# Patient Record
Sex: Female | Born: 1990 | Race: White | Hispanic: No | Marital: Married | Smoking: Never smoker
Health system: Southern US, Community
[De-identification: ages and names within clinical notes are randomized; demographics above are authoritative.]

## PROBLEM LIST (undated history)

## (undated) DIAGNOSIS — F419 Anxiety disorder, unspecified: Secondary | ICD-10-CM

## (undated) DIAGNOSIS — G43909 Migraine, unspecified, not intractable, without status migrainosus: Secondary | ICD-10-CM

## (undated) DIAGNOSIS — R7989 Other specified abnormal findings of blood chemistry: Secondary | ICD-10-CM

## (undated) DIAGNOSIS — N946 Dysmenorrhea, unspecified: Secondary | ICD-10-CM

## (undated) HISTORY — DX: Other specified abnormal findings of blood chemistry: R79.89

## (undated) HISTORY — DX: Migraine, unspecified, not intractable, without status migrainosus: G43.909

## (undated) HISTORY — PX: ABDOMINOPLASTY: SUR9

## (undated) HISTORY — DX: Dysmenorrhea, unspecified: N94.6

## (undated) HISTORY — DX: Anxiety disorder, unspecified: F41.9

## (undated) HISTORY — PX: OTHER SURGICAL HISTORY: SHX169

## (undated) HISTORY — PX: AUGMENTATION MAMMAPLASTY: SUR837

---

## 2018-02-25 ENCOUNTER — Ambulatory Visit: Payer: BLUE CROSS/BLUE SHIELD | Admitting: Obstetrics and Gynecology

## 2018-02-25 ENCOUNTER — Encounter: Payer: Self-pay | Admitting: Obstetrics and Gynecology

## 2018-02-25 ENCOUNTER — Other Ambulatory Visit: Payer: Self-pay

## 2018-02-25 VITALS — BP 110/72 | HR 70 | Resp 16 | Ht 64.5 in | Wt 148.8 lb

## 2018-02-25 DIAGNOSIS — Z3009 Encounter for other general counseling and advice on contraception: Secondary | ICD-10-CM

## 2018-02-25 DIAGNOSIS — Z01419 Encounter for gynecological examination (general) (routine) without abnormal findings: Secondary | ICD-10-CM

## 2018-02-25 NOTE — Patient Instructions (Signed)

## 2018-02-25 NOTE — Progress Notes (Signed)
27 y.o. G6P1001 Married Caucasian/Brazilian female here for annual exam.    20 year old son with her today.  School not in session.  Taking Diflin ethinyl estradiol 35 mcg, acetato de ciproterona 2 mg. Has nausea.   Having some PMS symptoms.  Cramps, headaches, feeling irritated, anxious, sore breasts.   Weight gain since moving from [place], 6 kilos.  Difficult for her to explain.   Denies HTN, liver or breast cancer, DVT/PE Mother with cardiac disease.  Patient has migraines with aura.   Saw neurologist in [place].  Has son 17 yo.  From [place].  Husband works for [employer].   PCP:  None  Patient's last menstrual period was 02/23/2018 (exact date).     Period Cycle (Days): 30 Period Duration (Days): 6-7 days Period Pattern: Regular Menstrual Flow: Moderate Menstrual Control: Tampon, Thin pad Menstrual Control Change Freq (Hours): every 3 hours on heaviest day Dysmenorrhea: None     Sexually active: Yes.   female The current method of family planning is OCP (estrogen/progesterone).    Exercising: Yes.    works out at [employer] 3x/week. Smoker:  no  Health Maintenance: Pap: 04/2017 normal per patient History of abnormal Pap:  no MMG: 2018 normal in [place].    Colonoscopy:  n/a BMD:   n/a  Result  n/a TDaP:  02/2017 Gardasil:   She doesn't think so HIV: probably in pregnancy.    Screening Labs:    reports that she has never smoked. She has never used smokeless tobacco. She reports that she drinks about 1.0 standard drinks of alcohol per week. She reports that she does not use drugs.  Past Medical History:  Diagnosis Date  . Anxiety   . Dysmenorrhea   . Migraines    with aura    Past Surgical History:  Procedure Laterality Date  . CESAREAN SECTION  2014   in [place]    Current Outpatient Medications  Medication Sig Dispense Refill  . UNABLE TO FIND Med Name: Diclin --OCPs from [place]     No current facility-administered medications for this visit.     Family  History  Problem Relation Age of Onset  . Hypertension Mother   . Heart disease Mother     Review of Systems  Psychiatric/Behavioral: The patient is nervous/anxious.   All other systems reviewed and are negative.   Exam:   BP 110/72 (BP Location: Right Arm, Patient Position: Sitting, Cuff Size: Normal)   Pulse 70   Resp 16   Ht 5' 4.5" (1.638 m)   Wt 148 lb 12.8 oz (67.5 kg)   LMP 02/23/2018 (Exact Date)   BMI 25.15 kg/m     General appearance: alert, cooperative and appears stated age Head: Normocephalic, without obvious abnormality, atraumatic Neck: no adenopathy, supple, symmetrical, trachea midline and thyroid normal to inspection and palpation Lungs: clear to auscultation bilaterally Breasts: normal appearance, no masses or tenderness, No nipple retraction or dimpling, No nipple discharge or bleeding, No axillary or supraclavicular adenopathy Heart: regular rate and rhythm Abdomen: soft, non-tender; no masses, no organomegaly Extremities: extremities normal, atraumatic, no cyanosis or edema Skin: Skin color, texture, turgor normal. No rashes or lesions Lymph nodes: Cervical, supraclavicular, and axillary nodes normal. No abnormal inguinal nodes palpated Neurologic: Grossly normal  Pelvic: External genitalia:  no lesions              Urethra:  normal appearing urethra with no masses, tenderness or lesions  Bartholins and Skenes: normal                 Vagina: normal appearing vagina with normal color and discharge, no lesions              Cervix: no lesions.  Small amount of old blood.              Pap taken: No. Bimanual Exam:  Uterus:  normal size, contour, position, consistency, mobility, non-tender              Adnexa: no mass, fullness, tenderness     Chaperone was present for exam.  Assessment:   Well woman visit with normal exam. Migraines with aura.  Weight gain.   Plan: Mammogram screening. Recommended self breast awareness. Pap and HR HPV  as above. Guidelines for Calcium, Vitamin D, regular exercise program including cardiovascular and weight bearing exercise. Stop COCs.  Rationale explained.  Return for Mirena IUD.  Risks and benefits reviewed.  TSH, chol, CMP, CBC. Follow up annually and prn.    After visit summary provided.

## 2018-02-26 LAB — LIPID PANEL
Chol/HDL Ratio: 2.9 ratio (ref 0.0–4.4)
Cholesterol, Total: 247 mg/dL — ABNORMAL HIGH (ref 100–199)
HDL: 84 mg/dL (ref >39)
LDL Calculated: 146 mg/dL — ABNORMAL HIGH (ref 0–99)
TRIGLYCERIDES: 85 mg/dL (ref 0–149)
VLDL CHOLESTEROL CAL: 17 mg/dL (ref 5–40)

## 2018-02-26 LAB — COMPREHENSIVE METABOLIC PANEL
A/G RATIO: 1.7 (ref 1.2–2.2)
ALT: 7 IU/L (ref 0–32)
AST: 14 IU/L (ref 0–40)
Albumin: 4.4 g/dL (ref 3.5–5.5)
Alkaline Phosphatase: 89 IU/L (ref 39–117)
BILIRUBIN TOTAL: 0.3 mg/dL (ref 0.0–1.2)
BUN/Creatinine Ratio: 14 (ref 9–23)
BUN: 11 mg/dL (ref 6–20)
CHLORIDE: 99 mmol/L (ref 96–106)
CO2: 23 mmol/L (ref 20–29)
Calcium: 9.5 mg/dL (ref 8.7–10.2)
Creatinine, Ser: 0.77 mg/dL (ref 0.57–1.00)
GFR calc Af Amer: 122 mL/min/{1.73_m2} (ref >59)
GFR calc non Af Amer: 105 mL/min/{1.73_m2} (ref >59)
GLOBULIN, TOTAL: 2.6 g/dL (ref 1.5–4.5)
Glucose: 83 mg/dL (ref 65–99)
POTASSIUM: 3.8 mmol/L (ref 3.5–5.2)
SODIUM: 139 mmol/L (ref 134–144)
TOTAL PROTEIN: 7 g/dL (ref 6.0–8.5)

## 2018-02-26 LAB — CBC
HEMATOCRIT: 36.4 % (ref 34.0–46.6)
Hemoglobin: 12.3 g/dL (ref 11.1–15.9)
MCH: 28.6 pg (ref 26.6–33.0)
MCHC: 33.8 g/dL (ref 31.5–35.7)
MCV: 85 fL (ref 79–97)
PLATELETS: 292 10*3/uL (ref 150–450)
RBC: 4.3 x10E6/uL (ref 3.77–5.28)
RDW: 12.4 % (ref 12.3–15.4)
WBC: 6.2 10*3/uL (ref 3.4–10.8)

## 2018-02-26 LAB — TSH: TSH: 2.43 u[IU]/mL (ref 0.450–4.500)

## 2018-03-01 ENCOUNTER — Telehealth: Payer: Self-pay | Admitting: *Deleted

## 2018-03-01 NOTE — Telephone Encounter (Signed)
Notes recorded by Leda MinHamm, Peter Keyworth N, RN on 03/01/2018 at 1:51 PM EST Call placed to patient using [facility] [place], [place] #[id number].  No answer on patients mobile number, voicemail not set up. Call placed to spouse, Meta HatchetGustavo, ok per dpr. No answer, voicemail not set up.

## 2018-03-01 NOTE — Telephone Encounter (Signed)
-----   Message from Brandy SallesBrook E Amundson C Rhodus, MD sent at 03/01/2018  6:56 AM EST ----- Please report results to patient.  You may need a [place] interpretor.  Her total cholesterol and LDL are elevated, but her cholesterol ratios are normal. A diet low in cholesterol and exercise can help to lower her LDL.  Her blood chemistries, blood counts, and thyroid are all normal.

## 2018-03-05 NOTE — Telephone Encounter (Signed)
Notes recorded by Leda MinHamm, Kiah Vanalstine N, RN on 03/05/2018 at 12:30 PM EST Call placed to patient using [facility] [place], [place] #[id number].  No answer on patients mobile number, voicemail not set up. Call placed to spouse, Meta HatchetGustavo, ok per dpr. No answer, voicemail not set up.

## 2018-03-08 ENCOUNTER — Telehealth: Payer: Self-pay | Admitting: Obstetrics and Gynecology

## 2018-03-08 NOTE — Telephone Encounter (Signed)
Patient cancelled her mirena insertion because of a conference at [employer]. Would like a call to reschedule.

## 2018-03-08 NOTE — Telephone Encounter (Signed)
Call returned using [facility] [place], [place]# [id number]. No answer, voicemail not set up.  1. Reschedule Mirena IUD insertion.   2. See lab results dated 02/25/18

## 2018-03-12 ENCOUNTER — Ambulatory Visit: Payer: BLUE CROSS/BLUE SHIELD | Admitting: Obstetrics and Gynecology

## 2018-03-12 NOTE — Telephone Encounter (Signed)
Spoke with patient using [address] [place], [place] #[id number].   1. Advised of lab results dated 02/25/18 per Dr. Edward JollySilva.   2. LMP 02/23/18. OCP for contraceptive. Mirena IUD insertion scheduled for 03/21/18 at 3pm with Dr. Edward JollySilva. Advised to take Motrin 800 mg with food and water one hour before procedure.  Patient verbalizes understanding and is agreeable.   Routing to provider for final review. Patient is agreeable to disposition. Will close encounter.

## 2018-03-12 NOTE — Telephone Encounter (Signed)
Results reviewed with patient, see telephone encounter dated 03/08/18.   Encounter closed.

## 2018-03-21 ENCOUNTER — Other Ambulatory Visit: Payer: Self-pay

## 2018-03-21 ENCOUNTER — Encounter: Payer: Self-pay | Admitting: Obstetrics and Gynecology

## 2018-03-21 ENCOUNTER — Ambulatory Visit (INDEPENDENT_AMBULATORY_CARE_PROVIDER_SITE_OTHER): Payer: BLUE CROSS/BLUE SHIELD | Admitting: Obstetrics and Gynecology

## 2018-03-21 VITALS — BP 108/70 | HR 80 | Resp 16 | Wt 154.0 lb

## 2018-03-21 DIAGNOSIS — Z3009 Encounter for other general counseling and advice on contraception: Secondary | ICD-10-CM

## 2018-03-21 DIAGNOSIS — Z30430 Encounter for insertion of intrauterine contraceptive device: Secondary | ICD-10-CM

## 2018-03-21 DIAGNOSIS — Z01812 Encounter for preprocedural laboratory examination: Secondary | ICD-10-CM | POA: Diagnosis not present

## 2018-03-21 LAB — POCT URINE PREGNANCY: Preg Test, Ur: NEGATIVE

## 2018-03-21 NOTE — Progress Notes (Signed)
GYNECOLOGY  VISIT   HPI: 27 y.o.   Married  Caucasian/Brazilian  female   G1P1001 with Patient's last menstrual period was 02/23/2018 (exact date).   here for Mirena IUD insertion.    Took Tylenol prior to visit.   Did not use Cytotec.  UPT - negative.   GYNECOLOGIC HISTORY: Patient's last menstrual period was 02/23/2018 (exact date). Contraception: OCP Menopausal hormone therapy:  n/a Last mammogram: 2018 normal in [place] per patient Last pap smear: 04/2017 normal per patient        OB History    Gravida  1   Para  1   Term  1   Preterm      AB      Living  1     SAB      TAB      Ectopic      Multiple      Live Births                 There are no active problems to display for this patient.   Past Medical History:  Diagnosis Date  . Anxiety   . Dysmenorrhea   . Migraines    with aura    Past Surgical History:  Procedure Laterality Date  . CESAREAN SECTION  2014   in [place]    Current Outpatient Medications  Medication Sig Dispense Refill  . UNABLE TO FIND Med Name: Diclin --OCPs from [place]     No current facility-administered medications for this visit.      ALLERGIES: Patient has no known allergies.  Family History  Problem Relation Age of Onset  . Hypertension Mother   . Heart disease Mother     Social History   Socioeconomic History  . Marital status: Married    Spouse name: Not on file  . Number of children: Not on file  . Years of education: Not on file  . Highest education level: Not on file  Occupational History  . Not on file  Social Needs  . Financial resource strain: Not on file  . Food insecurity:    Worry: Not on file    Inability: Not on file  . Transportation needs:    Medical: Not on file    Non-medical: Not on file  Tobacco Use  . Smoking status: Never Smoker  . Smokeless tobacco: Never Used  Substance and Sexual Activity  . Alcohol use: Yes    Alcohol/week: 1.0 standard drinks    Types: 1  Glasses of wine per week  . Drug use: Never  . Sexual activity: Yes    Birth control/protection: Pill  Lifestyle  . Physical activity:    Days per week: Not on file    Minutes per session: Not on file  . Stress: Not on file  Relationships  . Social connections:    Talks on phone: Not on file    Gets together: Not on file    Attends religious service: Not on file    Active member of club or organization: Not on file    Attends meetings of clubs or organizations: Not on file    Relationship status: Not on file  . Intimate partner violence:    Fear of current or ex partner: Not on file    Emotionally abused: Not on file    Physically abused: Not on file    Forced sexual activity: Not on file  Other Topics Concern  . Not on file  Social History  Narrative  . Not on file    Review of Systems  Constitutional: Negative.   HENT: Negative.   Eyes: Negative.   Respiratory: Negative.   Cardiovascular: Negative.   Gastrointestinal: Negative.   Endocrine: Negative.   Genitourinary: Negative.   Musculoskeletal: Negative.   Skin: Negative.   Allergic/Immunologic: Negative.   Neurological: Negative.   Hematological: Negative.   Psychiatric/Behavioral: Negative.     PHYSICAL EXAMINATION:    BP 108/70 (BP Location: Right Arm, Patient Position: Sitting, Cuff Size: Normal)   Pulse 80   Resp 16   Wt 154 lb (69.9 kg)   LMP 02/23/2018 (Exact Date)   BMI 26.03 kg/m     General appearance: alert, cooperative and appears stated age   Pelvic: External genitalia:  no lesions              Urethra:  normal appearing urethra with no masses, tenderness or lesions              Bartholins and Skenes: normal                 Vagina: normal appearing vagina with normal color and discharge, no lesions              Cervix: no lesions                Bimanual Exam:  Uterus:  normal size, contour, position, consistency, mobility, non-tender              Adnexa: no mass, fullness, tenderness           Consent for Mirena IUD insertion.  Lot  [id number]     , expiration  [id number]. Speculum placed in vagina.  Sterile prep of cervix with Hibiclens Paracervical block with 10 cc 1% lidocaine - lot [id number]     , expiration  1/23. Tenaculum to anterior cervical lip.  Os finder used.  Uterus sounded to  7.5  cm.  Mirena IUD placed without difficulty.  Strings trimmed and shown to patient.  Repeat bimanual exam, no change. No complications.  Minimal EBL.  ASSESSMENT  IUD insertion.   PLAN  Instructions and precautions given.  Back up contraception discussed. Card given to patient with insertion date, recommended removal date, and lot number.  Follow up for a recheck in 4 weeks, sooner as needed.  After visit summary to patient.    An After Visit Summary was printed and given to the patient.

## 2018-03-21 NOTE — Patient Instructions (Signed)

## 2018-04-22 ENCOUNTER — Other Ambulatory Visit: Payer: Self-pay

## 2018-04-22 ENCOUNTER — Ambulatory Visit: Payer: BLUE CROSS/BLUE SHIELD | Admitting: Obstetrics and Gynecology

## 2018-04-22 ENCOUNTER — Encounter: Payer: Self-pay | Admitting: Obstetrics and Gynecology

## 2018-04-22 VITALS — BP 100/62 | HR 84 | Ht 64.5 in | Wt 154.0 lb

## 2018-04-22 DIAGNOSIS — N9411 Superficial (introital) dyspareunia: Secondary | ICD-10-CM

## 2018-04-22 DIAGNOSIS — Z30431 Encounter for routine checking of intrauterine contraceptive device: Secondary | ICD-10-CM | POA: Diagnosis not present

## 2018-04-22 DIAGNOSIS — R635 Abnormal weight gain: Secondary | ICD-10-CM | POA: Diagnosis not present

## 2018-04-22 NOTE — Progress Notes (Signed)
GYNECOLOGY  VISIT   HPI: 28 y.o.   Married  Caucasian/brazilian  female   G1P1001 with Patient's last menstrual period was 02/23/2018 (exact date).   here for 4 week follow up after Mirena IUD insertion.    Some cramping.  Having spotting.   Husband not reporting pain with intercourse.  Patient states she has insertional dyspareunia.  No pain with the rest of sexual activity.   Using neutral soap.  Worried about weight gain.  Exercising and eating right and still gaining.  Normal TSH 02/25/18.  GYNECOLOGIC HISTORY: Patient's last menstrual period was 02/23/2018 (exact date). Contraception:  Mirena IUD--03-21-18 Menopausal hormone therapy:  n/a Last mammogram:  n/a Last pap smear:   04/2017 normal per patient        OB History    Gravida  1   Para  1   Term  1   Preterm      AB      Living  1     SAB      TAB      Ectopic      Multiple      Live Births                 There are no active problems to display for this patient.   Past Medical History:  Diagnosis Date  . Anxiety   . Dysmenorrhea   . Migraines    with aura    Past Surgical History:  Procedure Laterality Date  . CESAREAN SECTION  2014   in [place]    Current Outpatient Medications  Medication Sig Dispense Refill  . levonorgestrel (MIRENA) 20 MCG/24HR IUD 1 each by Intrauterine route once.     No current facility-administered medications for this visit.      ALLERGIES: Patient has no known allergies.  Family History  Problem Relation Age of Onset  . Hypertension Mother   . Heart disease Mother     Social History   Socioeconomic History  . Marital status: Married    Spouse name: Not on file  . Number of children: Not on file  . Years of education: Not on file  . Highest education level: Not on file  Occupational History  . Not on file  Social Needs  . Financial resource strain: Not on file  . Food insecurity:    Worry: Not on file    Inability: Not on file  .  Transportation needs:    Medical: Not on file    Non-medical: Not on file  Tobacco Use  . Smoking status: Never Smoker  . Smokeless tobacco: Never Used  Substance and Sexual Activity  . Alcohol use: Yes    Alcohol/week: 1.0 standard drinks    Types: 1 Glasses of wine per week  . Drug use: Never  . Sexual activity: Yes    Birth control/protection: Pill  Lifestyle  . Physical activity:    Days per week: Not on file    Minutes per session: Not on file  . Stress: Not on file  Relationships  . Social connections:    Talks on phone: Not on file    Gets together: Not on file    Attends religious service: Not on file    Active member of club or organization: Not on file    Attends meetings of clubs or organizations: Not on file    Relationship status: Not on file  . Intimate partner violence:    Fear of current or  ex partner: Not on file    Emotionally abused: Not on file    Physically abused: Not on file    Forced sexual activity: Not on file  Other Topics Concern  . Not on file  Social History Narrative  . Not on file    Review of Systems  All other systems reviewed and are negative.   PHYSICAL EXAMINATION:    BP 100/62 (BP Location: Right Arm, Patient Position: Sitting, Cuff Size: Normal)   Pulse 84   Ht 5' 4.5" (1.638 m)   Wt 154 lb (69.9 kg)   LMP 02/23/2018 (Exact Date)   BMI 26.03 kg/m     General appearance: alert, cooperative and appears stated age     Pelvic: External genitalia:  no lesions              Urethra:  normal appearing urethra with no masses, tenderness or lesions              Bartholins and Skenes: normal                 Vagina: normal appearing vagina with normal color and discharge, no lesions              Cervix: no lesions.  IUD strings noted.  Small amount of dark blood noted.                 Bimanual Exam:  Uterus:  normal size, contour, position, consistency, mobility, non-tender              Adnexa: no mass, fullness, tenderness             Chaperone was present for exam.  ASSESSMENT  IUD check up.  Introital dyspareunia.  Weight gain.   PLAN  I discussed bleeding profiles with Mirena and gave reassurance regarding pregnancy prevention.  She will try water based lubricant for intercourse.  She will avoid local irritants.  Information given about Dr. Quillian Quince.  I do not recommend any additional lab work today.  Fu for annual exam and prn.    An After Visit Summary was printed and given to the patient.  ___25___ minutes face to face time of which over 50% was spent in counseling.

## 2018-08-29 ENCOUNTER — Telehealth: Payer: Self-pay | Admitting: Obstetrics and Gynecology

## 2018-08-29 ENCOUNTER — Encounter: Payer: Self-pay | Admitting: Obstetrics and Gynecology

## 2018-08-29 ENCOUNTER — Other Ambulatory Visit: Payer: Self-pay

## 2018-08-29 ENCOUNTER — Other Ambulatory Visit: Payer: BLUE CROSS/BLUE SHIELD

## 2018-08-29 ENCOUNTER — Ambulatory Visit (INDEPENDENT_AMBULATORY_CARE_PROVIDER_SITE_OTHER): Payer: BLUE CROSS/BLUE SHIELD

## 2018-08-29 ENCOUNTER — Other Ambulatory Visit: Payer: BLUE CROSS/BLUE SHIELD | Admitting: Obstetrics and Gynecology

## 2018-08-29 ENCOUNTER — Ambulatory Visit (INDEPENDENT_AMBULATORY_CARE_PROVIDER_SITE_OTHER): Payer: BLUE CROSS/BLUE SHIELD | Admitting: Obstetrics and Gynecology

## 2018-08-29 VITALS — BP 102/64 | HR 64 | Temp 97.9°F | Resp 16 | Wt 147.0 lb

## 2018-08-29 DIAGNOSIS — Z30431 Encounter for routine checking of intrauterine contraceptive device: Secondary | ICD-10-CM

## 2018-08-29 DIAGNOSIS — R102 Pelvic and perineal pain unspecified side: Secondary | ICD-10-CM

## 2018-08-29 DIAGNOSIS — N83201 Unspecified ovarian cyst, right side: Secondary | ICD-10-CM

## 2018-08-29 LAB — POCT URINE PREGNANCY: Preg Test, Ur: NEGATIVE

## 2018-08-29 NOTE — Telephone Encounter (Signed)
Patient is having pain, she thinks may be related to her IUD.

## 2018-08-29 NOTE — Telephone Encounter (Signed)
Patient has a Mirena IUD that was placed 03/21/2018. Since Monday 08/25/2017 patient has been having ongoing pelvic pain. Reports is is painful when she sits, walking, and uses the restroom. "It feels like someone is sticking a needle in me." Denies any vaginal bleeding, fever, chills, or urinary symptoms. Reports nausea without vomiting or bowel changes. Reports pain is a 5/10 currently after taking Pamprin. Pain can be a 8/10 at its peak. Advised will review with Dr.Ricci and return call. Patient is agreeable.

## 2018-08-29 NOTE — Telephone Encounter (Signed)
Spoke with patient. Appointment for PUS scheduled for today at 2:30 pm with 3 pm consult with Dr.Haran. Patient is agreeable to date and time. Order placed.   Routing to provider and will close encounter.

## 2018-08-29 NOTE — Progress Notes (Signed)
GYNECOLOGY  VISIT   HPI: 28 y.o.   Married  Caucasian/Brazilian  female   G1P1001 with No LMP recorded.   here for ultrasound, pelvic pain with IUD    3 days ago developed a cramping pain and poking pain in her back and vaginal/pelvic discomfort.  She feels a colic like pain that comes and goes and is sharp in nature.  She feels vaginal pain and rectal pain also that feels like a pressure.  This is constant.  No dysuria.   She had a similar pain to this during the last 2 years.  The pain this week is more strong and is similar to like when she had her IUD placed.   No vaginal bleeding.   She felt a little bit nausea.  No fever.   No vomiting or diarrhea.   GYNECOLOGIC HISTORY: No LMP recorded. Contraception:  IUD - Mirena Menopausal hormone therapy:  none Last mammogram:  none Last pap smear:   04/2017 neg per patient upt-neg         OB History    Gravida  1   Para  1   Term  1   Preterm      AB      Living  1     SAB      TAB      Ectopic      Multiple      Live Births                 There are no active problems to display for this patient.   Past Medical History:  Diagnosis Date  . Anxiety   . Dysmenorrhea   . Migraines    with aura    Past Surgical History:  Procedure Laterality Date  . CESAREAN SECTION  2014   in [place]    Current Outpatient Medications  Medication Sig Dispense Refill  . levonorgestrel (MIRENA) 20 MCG/24HR IUD 1 each by Intrauterine route once.    . Semaglutide (OZEMPIC, 0.25 OR 0.5 MG/DOSE, [place]) Inject into the skin.     No current facility-administered medications for this visit.      ALLERGIES: Patient has no known allergies.  Family History  Problem Relation Age of Onset  . Hypertension Mother   . Heart disease Mother     Social History   Socioeconomic History  . Marital status: Married    Spouse name: Not on file  . Number of children: Not on file  . Years of education: Not on file  . Highest  education level: Not on file  Occupational History  . Not on file  Social Needs  . Financial resource strain: Not on file  . Food insecurity:    Worry: Not on file    Inability: Not on file  . Transportation needs:    Medical: Not on file    Non-medical: Not on file  Tobacco Use  . Smoking status: Never Smoker  . Smokeless tobacco: Never Used  Substance and Sexual Activity  . Alcohol use: Yes    Alcohol/week: 0.0 - 1.0 standard drinks  . Drug use: Never  . Sexual activity: Yes    Partners: Male    Birth control/protection: I.U.D.  Lifestyle  . Physical activity:    Days per week: Not on file    Minutes per session: Not on file  . Stress: Not on file  Relationships  . Social connections:    Talks on phone: Not on file  Gets together: Not on file    Attends religious service: Not on file    Active member of club or organization: Not on file    Attends meetings of clubs or organizations: Not on file    Relationship status: Not on file  . Intimate partner violence:    Fear of current or ex partner: Not on file    Emotionally abused: Not on file    Physically abused: Not on file    Forced sexual activity: Not on file  Other Topics Concern  . Not on file  Social History Narrative  . Not on file    Review of Systems  Constitutional: Negative.        Nausea  HENT: Negative.   Eyes: Negative.   Respiratory: Negative.   Cardiovascular: Negative.   Gastrointestinal: Negative.   Endocrine: Negative.   Genitourinary: Positive for pelvic pain.       Back pain  Musculoskeletal: Negative.   Skin: Negative.   Allergic/Immunologic: Negative.   Neurological: Negative.   Psychiatric/Behavioral: Negative.     PHYSICAL EXAMINATION:    BP 102/64   Pulse 64   Temp 97.9 F (36.6 C) (Skin)   Resp 16   Wt 147 lb (66.7 kg)   BMI 24.84 kg/m     General appearance: alert, cooperative and appears stated age.  Smiling.  Appears comfortable.  Pelvic: External genitalia:   no lesions              Urethra:  normal appearing urethra with no masses, tenderness or lesions              Bartholins and Skenes: normal                 Vagina: normal appearing vagina with normal color and discharge, no lesions              Cervix: no lesions.  IUD strings noted.                 Bimanual Exam:  Uterus:  normal size, contour, position, consistency, mobility, non-tender              Adnexa: tenderness and fullness of right adnexa.  Tender to palpation behind the cervix in the cul de sac.           Chaperone was present for exam.  Pelvic US Uterus not masses.  IUD in correct position in endometrial canal.  Right ovary with hemorrhagic ovarian cyst - 41 x 36 x 33 mm.  Left ovary normal.  Mild to mod fluid in cul de sac and right adnexa.  ASSESSMENT  Mirena IUD.  Hemorrhagic right ovarian cyst, ruptured.  No acute abdomen.   PLAN  UPT now.  Aleve 2 po bid prn. Pelvic rest until pain resolves.  I discussed risk of torsion.  Return for pelvic US in 6 weeks.   An After Visit Summary was printed and given to the patient.  __25____ minutes face to face time of which over 50% was spent in counseling.

## 2018-08-29 NOTE — Telephone Encounter (Signed)
Please schedule for office visit today and pelvic ultrasound.   Cc- Billie Ruddy, Harland Dingwall

## 2018-10-08 ENCOUNTER — Telehealth: Payer: Self-pay | Admitting: Obstetrics and Gynecology

## 2018-10-08 NOTE — Telephone Encounter (Signed)
Call placed to patient to schedule follow up ultrasound and to obtain new insurance information for pre-certification process. Unable to leave a voicemail message, as voicemail box is not set up.

## 2018-10-14 NOTE — Telephone Encounter (Signed)
Second cal placed to patient to scheduled a follow up ultrasound and to obtain new insurance information for prior approval. Again unable to leave a voicemail message, as the patients voice mailbox is not set up.    cc: Lamont Snowball, RN

## 2018-11-14 ENCOUNTER — Ambulatory Visit (INDEPENDENT_AMBULATORY_CARE_PROVIDER_SITE_OTHER): Payer: BC Managed Care – PPO

## 2018-11-14 ENCOUNTER — Encounter: Payer: Self-pay | Admitting: Obstetrics and Gynecology

## 2018-11-14 ENCOUNTER — Ambulatory Visit: Payer: BC Managed Care – PPO | Admitting: Obstetrics and Gynecology

## 2018-11-14 ENCOUNTER — Other Ambulatory Visit: Payer: Self-pay

## 2018-11-14 VITALS — BP 100/72 | HR 76 | Temp 97.8°F | Resp 12 | Ht 64.5 in | Wt 150.6 lb

## 2018-11-14 DIAGNOSIS — Z8742 Personal history of other diseases of the female genital tract: Secondary | ICD-10-CM

## 2018-11-14 DIAGNOSIS — G4700 Insomnia, unspecified: Secondary | ICD-10-CM

## 2018-11-14 DIAGNOSIS — N83201 Unspecified ovarian cyst, right side: Secondary | ICD-10-CM

## 2018-11-14 DIAGNOSIS — R4586 Emotional lability: Secondary | ICD-10-CM | POA: Diagnosis not present

## 2018-11-14 NOTE — Progress Notes (Signed)
GYNECOLOGY  VISIT   HPI: 28 y.o.   Married  [place]  female   [phone] with Patient's last menstrual period was 09/04/2018.   here for ultrasound for right ovarian cyst  No more pain like she had when she was diagnosed with the cyst.   Prior US showing a hemorrhagic right ovarian cyst 41 x 36 x 33 mm on 08/29/18.   Likes her Mirena overall.  No regular menses.  Has some mood swings during the pandemic. She wonders if it cycle related.  Also not sleeping well. Has insomnia. Tried melatonin.  She had mood swings prior to the Mirena IUD also.  GYNECOLOGIC HISTORY: Patient's last menstrual period was 09/04/2018. Contraception:  Mirena IUD  Menopausal hormone therapy:  n/a Last mammogram:  n/a Last pap smear:   04/2017 neg per patient        OB History    Gravida  1   Para  1   Term  1   Preterm      AB      Living  1     SAB      TAB      Ectopic      Multiple      Live Births                 There are no active problems to display for this patient.   Past Medical History:  Diagnosis Date  . Anxiety   . Dysmenorrhea   . Migraines    with aura    Past Surgical History:  Procedure Laterality Date  . CESAREAN SECTION  2014   in [place]    Current Outpatient Medications  Medication Sig Dispense Refill  . levonorgestrel (MIRENA) 20 MCG/24HR IUD 1 each by Intrauterine route once.     No current facility-administered medications for this visit.      ALLERGIES: Patient has no known allergies.  Family History  Problem Relation Age of Onset  . Hypertension Mother   . Heart disease Mother     Social History   Socioeconomic History  . Marital status: Married    Spouse name: Not on file  . Number of children: Not on file  . Years of education: Not on file  . Highest education level: Not on file  Occupational History  . Not on file  Social Needs  . Financial resource strain: Not on file  . Food insecurity    Worry: Not on file   Inability: Not on file  . Transportation needs    Medical: Not on file    Non-medical: Not on file  Tobacco Use  . Smoking status: Never Smoker  . Smokeless tobacco: Never Used  Substance and Sexual Activity  . Alcohol use: Yes    Alcohol/week: 0.0 - 1.0 standard drinks  . Drug use: Never  . Sexual activity: Yes    Partners: Male    Birth control/protection: I.U.D.  Lifestyle  . Physical activity    Days per week: Not on file    Minutes per session: Not on file  . Stress: Not on file  Relationships  . Social Musicianconnections    Talks on phone: Not on file    Gets together: Not on file    Attends religious service: Not on file    Active member of club or organization: Not on file    Attends meetings of clubs or organizations: Not on file    Relationship status: Not on file  .  Intimate partner violence    Fear of current or ex partner: Not on file    Emotionally abused: Not on file    Physically abused: Not on file    Forced sexual activity: Not on file  Other Topics Concern  . Not on file  Social History Narrative  . Not on file    Review of Systems  Constitutional: Negative.   HENT: Negative.   Eyes: Negative.   Respiratory: Negative.   Cardiovascular: Negative.   Gastrointestinal: Negative.   Endocrine: Negative.   Genitourinary: Negative.   Musculoskeletal: Negative.   Skin: Negative.   Allergic/Immunologic: Negative.   Neurological: Negative.   Hematological: Negative.   Psychiatric/Behavioral: Negative.     PHYSICAL EXAMINATION:    BP 100/72 (BP Location: Left Arm, Patient Position: Sitting, Cuff Size: Normal)   Pulse 76   Temp 97.8 F (36.6 C) (Temporal)   Resp 12   Ht 5' 4.5" (1.638 m)   Wt 150 lb 9.6 oz (68.3 kg)   LMP 09/04/2018   BMI 25.45 kg/m     General appearance: alert, cooperative and appears stated age   Pelvic US Uterus no masses.  EMS 2.37 mm. IUD in endometrial canal.  Ovaries normal.  Cyst resolved.  No free fluid.    ASSESSMENT  Ovarian cyst resolved.  Mirena IUD.  Migraine with aura.  Mood swings.    PLAN  Reassurance regarding resolution of cyst.  We discussed that Mirena does not block ovulation and cyst formation of the ovaries.  She is not a candidate for combined oral contraception due to her migraine with aura.  I gave her the name of Dr. Ruben Gottron for general medical care.  I encouraged her to do web based consultation with a psychologist in [place] as she wants to consult with someone who speaks [place].  Fu in Nov for annual exam.   An After Visit Summary was printed and given to the patient.  _15_____ minutes face to face time of which over 50% was spent in counseling.

## 2018-11-14 NOTE — Patient Instructions (Signed)
Please consider Dr. Rolan Lipa in [facility] for a general medicine doctor.

## 2019-01-21 DIAGNOSIS — R635 Abnormal weight gain: Secondary | ICD-10-CM | POA: Diagnosis not present

## 2019-01-21 DIAGNOSIS — E559 Vitamin D deficiency, unspecified: Secondary | ICD-10-CM | POA: Diagnosis not present

## 2019-01-21 DIAGNOSIS — E538 Deficiency of other specified B group vitamins: Secondary | ICD-10-CM | POA: Diagnosis not present

## 2019-01-21 DIAGNOSIS — Z13220 Encounter for screening for lipoid disorders: Secondary | ICD-10-CM | POA: Diagnosis not present

## 2019-01-21 DIAGNOSIS — Z1321 Encounter for screening for nutritional disorder: Secondary | ICD-10-CM | POA: Diagnosis not present

## 2019-01-21 DIAGNOSIS — R5383 Other fatigue: Secondary | ICD-10-CM | POA: Diagnosis not present

## 2019-01-21 DIAGNOSIS — Z1329 Encounter for screening for other suspected endocrine disorder: Secondary | ICD-10-CM | POA: Diagnosis not present

## 2019-01-21 DIAGNOSIS — G43909 Migraine, unspecified, not intractable, without status migrainosus: Secondary | ICD-10-CM | POA: Diagnosis not present

## 2019-02-12 DIAGNOSIS — M7989 Other specified soft tissue disorders: Secondary | ICD-10-CM | POA: Diagnosis not present

## 2019-02-12 DIAGNOSIS — G43909 Migraine, unspecified, not intractable, without status migrainosus: Secondary | ICD-10-CM | POA: Diagnosis not present

## 2019-02-12 DIAGNOSIS — R5383 Other fatigue: Secondary | ICD-10-CM | POA: Diagnosis not present

## 2019-02-12 DIAGNOSIS — L709 Acne, unspecified: Secondary | ICD-10-CM | POA: Diagnosis not present

## 2019-03-07 ENCOUNTER — Telehealth: Payer: Self-pay | Admitting: Obstetrics and Gynecology

## 2019-03-07 DIAGNOSIS — Z30432 Encounter for removal of intrauterine contraceptive device: Secondary | ICD-10-CM

## 2019-03-07 NOTE — Telephone Encounter (Signed)
Call to patient to convey benefits for iud removal. Spoke with the patient she understands/agreeable with the benefits. Patient is aware of the cancellation policy. Appointment scheduled 03/11/19.

## 2019-03-07 NOTE — Telephone Encounter (Signed)
Patient is calling to schedule IUD removal. °

## 2019-03-07 NOTE — Telephone Encounter (Signed)
Spoke with pt. Pt wanting IUD removed d/t migraines getting worse, cramping and weight gain. Pt wanting to go back to [facility]. Pt states went to family doctor in [facility] Dr Benjaman Kindler and they suggested for IUD to be removed. Had IUD in for 1 year Dec 2020. Pt scheduled 03/11/19 at 3:30pm with Dr Quincy Simmonds. Pt aware of call for benefits.   Will route to Dr Quincy Simmonds for review. Will close encounter   Orders placed for IUD removal.  Cc: Weston Brass

## 2019-03-11 ENCOUNTER — Ambulatory Visit (INDEPENDENT_AMBULATORY_CARE_PROVIDER_SITE_OTHER): Payer: BC Managed Care – PPO | Admitting: Obstetrics and Gynecology

## 2019-03-11 ENCOUNTER — Encounter: Payer: Self-pay | Admitting: Obstetrics and Gynecology

## 2019-03-11 ENCOUNTER — Other Ambulatory Visit: Payer: Self-pay

## 2019-03-11 VITALS — BP 120/62 | HR 70 | Temp 97.4°F | Ht 64.5 in | Wt 158.4 lb

## 2019-03-11 DIAGNOSIS — Z3009 Encounter for other general counseling and advice on contraception: Secondary | ICD-10-CM

## 2019-03-11 DIAGNOSIS — Z30432 Encounter for removal of intrauterine contraceptive device: Secondary | ICD-10-CM | POA: Diagnosis not present

## 2019-03-11 MED ORDER — NORETHINDRONE 0.35 MG PO TABS: 1.0000 | ORAL_TABLET | Freq: Every day | ORAL | 3 refills | Status: DC

## 2019-03-11 NOTE — Progress Notes (Signed)
GYNECOLOGY  VISIT   HPI: 28 y.o.   Married  [place]  female   G1P1001 with No LMP recorded.   here for Mirena IUD removal.   She had the IUD placed to control her migraine headaches.  She headaches are worse now.  Also having cramping in her lower abdomen.   She is having irregular bleeding.   She has seen a PCP for HA and weight loss.  She was told her progesterone is low and her estrogen is high.  She has a HA almost every day.  She had migraine 2 days per week and cannot get out of the bed.   She is having issues with acne and losing her hair.  GYNECOLOGIC HISTORY: No LMP recorded. Contraception: Mirena IUD - December 03/21/18.  Menopausal hormone therapy:  n/a Last mammogram:  n/a Last pap smear: 04/2017 Neg per patient        OB History    Gravida  1   Para  1   Term  1   Preterm      AB      Living  1     SAB      TAB      Ectopic      Multiple      Live Births                 There are no active problems to display for this patient.   Past Medical History:  Diagnosis Date  . Anxiety   . Dysmenorrhea   . Migraines    with aura    Past Surgical History:  Procedure Laterality Date  . CESAREAN SECTION  2014   in [place]    Current Outpatient Medications  Medication Sig Dispense Refill  . levonorgestrel (MIRENA) 20 MCG/24HR IUD 1 each by Intrauterine route once.     No current facility-administered medications for this visit.      ALLERGIES: Patient has no known allergies.  Family History  Problem Relation Age of Onset  . Hypertension Mother   . Heart disease Mother     Social History   Socioeconomic History  . Marital status: Married    Spouse name: Not on file  . Number of children: Not on file  . Years of education: Not on file  . Highest education level: Not on file  Occupational History  . Not on file  Social Needs  . Financial resource strain: Not on file  . Food insecurity    Worry: Not on file    Inability:  Not on file  . Transportation needs    Medical: Not on file    Non-medical: Not on file  Tobacco Use  . Smoking status: Never Smoker  . Smokeless tobacco: Never Used  Substance and Sexual Activity  . Alcohol use: Yes    Alcohol/week: 0.0 - 1.0 standard drinks  . Drug use: Never  . Sexual activity: Yes    Partners: Male    Birth control/protection: I.U.D.  Lifestyle  . Physical activity    Days per week: Not on file    Minutes per session: Not on file  . Stress: Not on file  Relationships  . Social Musician on phone: Not on file    Gets together: Not on file    Attends religious service: Not on file    Active member of club or organization: Not on file    Attends meetings of clubs or organizations: Not  on file    Relationship status: Not on file  . Intimate partner violence    Fear of current or ex partner: Not on file    Emotionally abused: Not on file    Physically abused: Not on file    Forced sexual activity: Not on file  Other Topics Concern  . Not on file  Social History Narrative  . Not on file    Review of Systems  All other systems reviewed and are negative.   PHYSICAL EXAMINATION:    BP 120/62   Pulse 70   Temp (!) 97.4 F (36.3 C) (Temporal)   Ht 5' 4.5" (1.638 m)   Wt 158 lb 6.4 oz (71.8 kg)   BMI 26.77 kg/m     General appearance: alert, cooperative and appears stated age    Pelvic: External genitalia:  no lesions              Urethra:  normal appearing urethra with no masses, tenderness or lesions              Bartholins and Skenes: normal                 Vagina: normal appearing vagina with normal color and discharge, no lesions              Cervix: no lesions.  IUD strings noted.                Bimanual Exam:  Uterus:  normal size, contour, position, consistency, mobility, non-tender              Adnexa: no mass, fullness, tenderness           IUD removal.  Verbal consent for procedure.  IUD removed with ring forceps,  intact, shown to patient, and discarded.  Chaperone was present for exam.  ASSESSMENT  Mirena IUD removal. Hx migraine with aura.  Headache.  PLAN  We discussed alternatives to the progesterone IUDs, Micronor, Paragard.  We reviewed risks and benefits.  She will start Micronor.  Instructed in use and in potential side effects.  She will let me know if she is having any concerns. Otherwise, we will do a recheck and annual exam in 3 months.    An After Visit Summary was printed and given to the patient.  _15_____ minutes face to face time of which over 50% was spent in counseling.

## 2019-05-26 ENCOUNTER — Ambulatory Visit: Payer: BC Managed Care – PPO | Admitting: Obstetrics and Gynecology

## 2019-05-26 ENCOUNTER — Other Ambulatory Visit: Payer: Self-pay

## 2019-05-26 ENCOUNTER — Encounter: Payer: Self-pay | Admitting: Obstetrics and Gynecology

## 2019-05-26 VITALS — BP 122/74 | HR 70 | Temp 97.3°F | Ht 64.5 in | Wt 165.0 lb

## 2019-05-26 DIAGNOSIS — N898 Other specified noninflammatory disorders of vagina: Secondary | ICD-10-CM | POA: Diagnosis not present

## 2019-05-26 DIAGNOSIS — R7989 Other specified abnormal findings of blood chemistry: Secondary | ICD-10-CM | POA: Diagnosis not present

## 2019-05-26 DIAGNOSIS — Z01419 Encounter for gynecological examination (general) (routine) without abnormal findings: Secondary | ICD-10-CM | POA: Diagnosis not present

## 2019-05-26 DIAGNOSIS — F32A Depression, unspecified: Secondary | ICD-10-CM

## 2019-05-26 DIAGNOSIS — F329 Major depressive disorder, single episode, unspecified: Secondary | ICD-10-CM

## 2019-05-26 MED ORDER — NORETHINDRONE 0.35 MG PO TABS: 1.0000 | ORAL_TABLET | Freq: Every day | ORAL | 3 refills | Status: DC

## 2019-05-26 NOTE — Patient Instructions (Signed)

## 2019-05-26 NOTE — Progress Notes (Signed)
GYNECOLOGY  VISIT   HPI: 29 y.o.   Married  [place]  female   [phone] with Patient's last menstrual period was 05/18/2019 (exact date).   here for annual exam and 3 month follow up of birth control.  Feeling much better since taking out Mirena.  Her skin is better.  Her flow with her menses is more heavy without Mirena.  Changing her pad about 4 - 5  times per day.  She has cramps and swelling with her cycle.  Her cramping is better with the pills.  She has some emotional changes prior to and during her menses.  She took medication for PMS no [place].   Does not remember the name.   She states she is having anxiety in general. Some depression since moving to the [place].  Gaining some weight.  She has had some thoughts of suicide some months ago but not now.  She had depression in [place] and treated there prior to moving here.  She is dealing with marital difficulty. Has a therapist in [place].  Has vaginal odor and discharge.   GYNECOLOGIC HISTORY: Patient's last menstrual period was 05/18/2019 (exact date). Contraception: POP Menopausal hormone therapy:  n/a Last mammogram:  2018 in [place].  Last pap smear: 04/2017 Neg per patient TDap:  02/2017.  Gardasil:  No Screening labs:  Today.  Flu vaccine:  Recommended.         OB History    Gravida  1   Para  1   Term  1   Preterm      AB      Living  1     SAB      TAB      Ectopic      Multiple      Live Births                 There are no problems to display for this patient.   Past Medical History:  Diagnosis Date  . Anxiety   . Dysmenorrhea   . Migraines    with aura    Past Surgical History:  Procedure Laterality Date  . CESAREAN SECTION  2014   in [place]    Current Outpatient Medications  Medication Sig Dispense Refill  . norethindrone (MICRONOR) 0.35 MG tablet Take 1 tablet (0.35 mg total) by mouth daily. 3 Package 3   No current facility-administered medications for this visit.      ALLERGIES: Patient has no known allergies.  Family History  Problem Relation Age of Onset  . Hypertension Mother   . Heart disease Mother     Social History   Socioeconomic History  . Marital status: Married    Spouse name: Not on file  . Number of children: Not on file  . Years of education: Not on file  . Highest education level: Not on file  Occupational History  . Not on file  Tobacco Use  . Smoking status: Never Smoker  . Smokeless tobacco: Never Used  Substance and Sexual Activity  . Alcohol use: Yes    Alcohol/week: 0.0 - 1.0 standard drinks  . Drug use: Never  . Sexual activity: Yes    Partners: Male    Birth control/protection: I.U.D.  Other Topics Concern  . Not on file  Social History Narrative  . Not on file   Social Determinants of Health   Financial Resource Strain:   . Difficulty of Paying Living Expenses: Not on file  Food Insecurity:   .  Worried About Programme researcher, broadcasting/film/video in the Last Year: Not on file  . Ran Out of Food in the Last Year: Not on file  Transportation Needs:   . Lack of Transportation (Medical): Not on file  . Lack of Transportation (Non-Medical): Not on file  Physical Activity:   . Days of Exercise per Week: Not on file  . Minutes of Exercise per Session: Not on file  Stress:   . Feeling of Stress : Not on file  Social Connections:   . Frequency of Communication with Friends and Family: Not on file  . Frequency of Social Gatherings with Friends and Family: Not on file  . Attends Religious Services: Not on file  . Active Member of Clubs or Organizations: Not on file  . Attends Banker Meetings: Not on file  . Marital Status: Not on file  Intimate Partner Violence:   . Fear of Current or Ex-Partner: Not on file  . Emotionally Abused: Not on file  . Physically Abused: Not on file  . Sexually Abused: Not on file    Review of Systems  All other systems reviewed and are negative.   PHYSICAL EXAMINATION:     BP 122/74   Pulse 70   Temp (!) 97.3 F (36.3 C) (Temporal)   Ht 5' 4.5" (1.638 m)   Wt 165 lb (74.8 kg)   LMP 05/18/2019 (Exact Date)   BMI 27.88 kg/m     General appearance: alert, cooperative and appears stated age Head: Normocephalic, without obvious abnormality, atraumatic Neck: no adenopathy, supple, symmetrical, trachea midline and thyroid normal to inspection and palpation Lungs: clear to auscultation bilaterally Breasts: normal appearance, no masses or tenderness, No nipple retraction or dimpling, No nipple discharge or bleeding, No axillary or supraclavicular adenopathy Heart: regular rate and rhythm Abdomen: soft, non-tender, no masses,  no organomegaly Extremities: extremities normal, atraumatic, no cyanosis or edema Skin: Skin color, texture, turgor normal. No rashes or lesions Lymph nodes: Cervical, supraclavicular, and axillary nodes normal. No abnormal inguinal nodes palpated Neurologic: Grossly normal  Pelvic: External genitalia:  no lesions              Urethra:  normal appearing urethra with no masses, tenderness or lesions              Bartholins and Skenes: normal                 Vagina: normal appearing vagina with normal color and discharge, no lesions              Cervix: no lesions              Pap:  No.                 Bimanual Exam:  Uterus:  normal size, contour, position, consistency, mobility, non-tender              Adnexa: no mass, fullness, tenderness              Chaperone was present for exam.  ASSESSMENT  Well woman visit.  Migraine with aura.  Depression.  PMS symptoms.  Vaginal odor.  PLAN  Pap and HR HPV not indicated.  Refill of Micronor for one year.  Routine mammogram age 55. Routine labs.  Affirm.  Healthy diet and exercise reviewed. Referral to psychiatry with recommendation for [place] interpretor present.  Fu annually and prn.     An After Visit Summary was printed and given to  the patient.

## 2019-05-27 LAB — VAGINITIS/VAGINOSIS, DNA PROBE
Candida Species: NEGATIVE
Gardnerella vaginalis: NEGATIVE
Trichomonas vaginosis: NEGATIVE

## 2019-05-28 LAB — VITAMIN D 25 HYDROXY (VIT D DEFICIENCY, FRACTURES): Vit D, 25-Hydroxy: 16.1 ng/mL — ABNORMAL LOW (ref 30.0–100.0)

## 2019-05-28 LAB — COMPREHENSIVE METABOLIC PANEL
ALT: 11 IU/L (ref 0–32)
AST: 17 IU/L (ref 0–40)
Albumin/Globulin Ratio: 1.7 (ref 1.2–2.2)
Albumin: 4.5 g/dL (ref 3.9–5.0)
Alkaline Phosphatase: 93 IU/L (ref 39–117)
BUN/Creatinine Ratio: 11 (ref 9–23)
BUN: 9 mg/dL (ref 6–20)
Bilirubin Total: <0.2 mg/dL (ref 0.0–1.2)
CO2: 26 mmol/L (ref 20–29)
Calcium: 10 mg/dL (ref 8.7–10.2)
Chloride: 98 mmol/L (ref 96–106)
Creatinine, Ser: 0.79 mg/dL (ref 0.57–1.00)
GFR calc Af Amer: 117 mL/min/{1.73_m2} (ref >59)
GFR calc non Af Amer: 101 mL/min/{1.73_m2} (ref >59)
Globulin, Total: 2.7 g/dL (ref 1.5–4.5)
Glucose: 72 mg/dL (ref 65–99)
Potassium: 3.8 mmol/L (ref 3.5–5.2)
Sodium: 138 mmol/L (ref 134–144)
Total Protein: 7.2 g/dL (ref 6.0–8.5)

## 2019-05-28 LAB — LIPID PANEL
Chol/HDL Ratio: 3 ratio (ref 0.0–4.4)
Cholesterol, Total: 222 mg/dL — ABNORMAL HIGH (ref 100–199)
HDL: 73 mg/dL (ref >39)
LDL Chol Calc (NIH): 136 mg/dL — ABNORMAL HIGH (ref 0–99)
Triglycerides: 77 mg/dL (ref 0–149)
VLDL Cholesterol Cal: 13 mg/dL (ref 5–40)

## 2019-05-28 LAB — CBC
Hematocrit: 37.1 % (ref 34.0–46.6)
Hemoglobin: 12.6 g/dL (ref 11.1–15.9)
MCH: 30.3 pg (ref 26.6–33.0)
MCHC: 34 g/dL (ref 31.5–35.7)
MCV: 89 fL (ref 79–97)
Platelets: 323 10*3/uL (ref 150–450)
RBC: 4.16 x10E6/uL (ref 3.77–5.28)
RDW: 12.5 % (ref 11.7–15.4)
WBC: 6.1 10*3/uL (ref 3.4–10.8)

## 2019-05-28 LAB — HEMOGLOBIN A1C
Est. average glucose Bld gHb Est-mCnc: 103 mg/dL
Hgb A1c MFr Bld: 5.2 % (ref 4.8–5.6)

## 2019-05-28 LAB — TSH: TSH: 1.63 u[IU]/mL (ref 0.450–4.500)

## 2019-05-29 ENCOUNTER — Telehealth: Payer: Self-pay | Admitting: Obstetrics and Gynecology

## 2019-05-29 NOTE — Telephone Encounter (Signed)
Please add BV and candida (yeast) testing to her Nuswab sent to [facility].

## 2019-05-30 NOTE — Addendum Note (Signed)
Addended by: Ardell Isaacs, Debbe Bales E on: 05/30/2019 02:05 PM   Modules accepted: Orders

## 2019-06-03 ENCOUNTER — Other Ambulatory Visit: Payer: Self-pay

## 2019-06-03 DIAGNOSIS — R7989 Other specified abnormal findings of blood chemistry: Secondary | ICD-10-CM

## 2019-06-03 DIAGNOSIS — F329 Major depressive disorder, single episode, unspecified: Secondary | ICD-10-CM

## 2019-06-03 DIAGNOSIS — F32A Depression, unspecified: Secondary | ICD-10-CM

## 2019-06-03 MED ORDER — VITAMIN D (ERGOCALCIFEROL) 1.25 MG (50000 UNIT) PO CAPS: 50000.0000 [IU] | ORAL_CAPSULE | ORAL | 0 refills | Status: DC

## 2019-06-03 NOTE — Telephone Encounter (Signed)
This was done through [facility]

## 2019-06-03 NOTE — Progress Notes (Signed)
Rx sent to pharmacy on file for Vit D 50000 IU # 12, 0RF per Dr Rica Records orders on lab results.

## 2019-06-23 ENCOUNTER — Other Ambulatory Visit: Payer: Self-pay

## 2019-06-23 ENCOUNTER — Emergency Department
Admission: EM | Admit: 2019-06-23 | Discharge: 2019-06-23 | Disposition: A | Discharge status: Home / Self Care | Payer: BC Managed Care – PPO | Source: Home / Self Care | Attending: Family Medicine | Admitting: Family Medicine

## 2019-06-23 ENCOUNTER — Emergency Department (INDEPENDENT_AMBULATORY_CARE_PROVIDER_SITE_OTHER): Payer: BC Managed Care – PPO

## 2019-06-23 DIAGNOSIS — R072 Precordial pain: Secondary | ICD-10-CM | POA: Diagnosis not present

## 2019-06-23 DIAGNOSIS — R0781 Pleurodynia: Secondary | ICD-10-CM

## 2019-06-23 DIAGNOSIS — M940 Chondrocostal junction syndrome [Tietze]: Secondary | ICD-10-CM

## 2019-06-23 NOTE — ED Triage Notes (Signed)
Patient presents to Urgent Care with complaints of upper abdominal pain since four days ago. Patient reports the pain is intermittent, also has back pain that she describes as burning. Pt has been taking omeprazole or something similar for her discomfort but it has not been helping.

## 2019-06-23 NOTE — Discharge Instructions (Addendum)
Take Ibuprofen 200mg , 4 tabs every 8 hours with food.  Apply ice pack for 20 to 30 minutes, 3 to 4 times daily  Continue until pain and swelling decrease.  May take Tylenol, if needed, for pain.

## 2019-06-23 NOTE — ED Provider Notes (Signed)
[facility]    CSN: [id number] Arrival date & time: 06/23/19  0813      History   Chief Complaint Chief Complaint  Patient presents with  . Abdominal Pain    HPI Brandy Turner is a 29 y.o. female.   Patient complains of onset of stabbing intermittent mid and upper sternum pain for about 4 days, now keeping her awake at night.  She has pain with full inspiration, and sensation of fully expanding her chest during inspiration.  She denies fevers, chills, and sweats.  She denies GI symptoms, having had no improvement after 2 doses of omeprazole 40mg .  She denies cough or recent URI.  She denies recent chest injury or changes in physical/athletic activities.  The history is provided by the patient.    Past Medical History:  Diagnosis Date  . Anxiety   . Dysmenorrhea   . Migraines    with aura    There are no problems to display for this patient.   Past Surgical History:  Procedure Laterality Date  . CESAREAN SECTION  2014   in    OB History    Gravida  1   Para  1   Term  1   Preterm      AB      Living  1     SAB      TAB      Ectopic      Multiple      Live Births               Home Medications    Prior to Admission medications   Medication Sig Start Date End Date Taking? Authorizing Provider  norethindrone (MICRONOR) 0.35 MG tablet Take 1 tablet (0.35 mg total) by mouth daily. 05/26/19   07/24/19, MD  Vitamin D, Ergocalciferol, (DRISDOL) 1.25 MG (50000 UNIT) CAPS capsule Take 1 capsule (50,000 Units total) by mouth every 7 (seven) days. 06/03/19   06/05/19, MD    Family History Family History  Problem Relation Age of Onset  . Hypertension Mother   . Heart disease Mother     Social History Social History   Tobacco Use  . Smoking status: Never Smoker  . Smokeless tobacco: Never Used  Substance Use Topics  . Alcohol use: Yes    Alcohol/week: 0.0 - 1.0 standard drinks    Comment:  occ  . Drug use: Never     Allergies   Patient has no known allergies.   Review of Systems Review of Systems No sore throat No cough No pleuritic pain, but complains of sternum pain with inspiration No wheezing No nasal congestion No post-nasal drainage No sinus pain/pressure No itchy/red eyes No earache No hemoptysis No SOB No fever,chills No nausea No vomiting No abdominal pain No diarrhea No urinary symptoms No skin rash No fatigue No myalgias No headache    Physical Exam Triage Vital Signs ED Triage Vitals  Enc Vitals Group     BP 06/23/19 0831 107/72     Pulse Rate 06/23/19 0831 69     Resp 06/23/19 0831 17     Temp 06/23/19 0831 98.4 F (36.9 C)     Temp Source 06/23/19 0831 Oral     SpO2 06/23/19 0831 97 %     Weight 06/23/19 0829 156 lb 8.4 oz (71 kg)     Height 06/23/19 0829 5' 4.57" (1.64 m)     Head  Circumference --      Peak Flow --      Pain Score 06/23/19 0829 8     Pain Loc --      Pain Edu? --      Excl. in GC? --    No data found.  Updated Vital Signs BP 107/72 (BP Location: Right Arm)   Pulse 69   Temp 98.4 F (36.9 C) (Oral)   Resp 17   Ht 5' 4.57" (1.64 m)   Wt 71 kg   SpO2 97%   BMI 26.40 kg/m   Visual Acuity Right Eye Distance:   Left Eye Distance:   Bilateral Distance:    Right Eye Near:   Left Eye Near:    Bilateral Near:     Physical Exam Vitals and nursing note reviewed.  Constitutional:      General: She is not in acute distress.    Appearance: She is not ill-appearing or diaphoretic.  HENT:     Head: Normocephalic.     Mouth/Throat:     Mouth: Mucous membranes are moist.  Eyes:     Extraocular Movements: Extraocular movements intact.     Conjunctiva/sclera: Conjunctivae normal.     Pupils: Pupils are equal, round, and reactive to light.  Cardiovascular:     Rate and Rhythm: Normal rate and regular rhythm.     Heart sounds: Normal heart sounds.  Pulmonary:     Effort: Pulmonary effort is normal.       Breath sounds: Normal breath sounds. No wheezing or rhonchi.  Chest:       Comments: Chest:  There is distinct tenderness to palpation over the sternum as noted on diagram.  Palpation there recreates her symptoms. Abdominal:     Palpations: Abdomen is soft.     Tenderness: There is no abdominal tenderness.  Musculoskeletal:     Cervical back: Neck supple.     Right lower leg: No edema.     Left lower leg: No edema.  Lymphadenopathy:     Cervical: No cervical adenopathy.  Skin:    General: Skin is warm and dry.     Findings: No rash.  Neurological:     Mental Status: She is alert.      UC Treatments / Results  Labs (all labs ordered are listed, but only abnormal results are displayed) Labs Reviewed - No data to display  EKG   Radiology DG Chest 2 View  Result Date: 06/23/2019 CLINICAL DATA:  Stabbing intermittent sternal pain, worse with inspiration. EXAM: CHEST - 2 VIEW COMPARISON:  None. FINDINGS: Trachea is midline. Heart size normal. Lungs may be minimally hyperinflated but are otherwise clear. No pleural fluid. IMPRESSION: Minimal hyperinflation.  No acute findings. Electronically Signed   By: Leanna Battles M.D.   On: 06/23/2019 09:52    Procedures Procedures (including critical care time)  Medications Ordered in UC Medications - No data to display  Initial Impression / Assessment and Plan / UC Course  I have reviewed the triage vital signs and the nursing notes.  Pertinent labs & imaging results that were available during my care of the patient were reviewed by me and considered in my medical decision making (see chart for details).    Negative chest x-ray reassuring. Followup with Dr. Rodney [facility] ([facility]) if not improving about two weeks.   Final Clinical Impressions(s) / UC Diagnoses   Final diagnoses:  Substernal chest pain  Costochondritis     Discharge Instructions  Take Ibuprofen 200mg , 4 tabs every 8 hours with  food.  Apply ice pack for 20 to 30 minutes, 3 to 4 times daily  Continue until pain and swelling decrease.  May take Tylenol, if needed, for pain.    ED Prescriptions    None        Kandra Nicolas, MD 06/23/19 1034

## 2019-06-25 NOTE — Addendum Note (Signed)
Addended by: Isabell Jarvis on: 06/25/2019 09:55 AM   Modules accepted: Orders

## 2019-09-01 ENCOUNTER — Other Ambulatory Visit: Payer: Self-pay

## 2019-09-30 ENCOUNTER — Telehealth: Payer: Self-pay | Admitting: Obstetrics and Gynecology

## 2019-09-30 NOTE — Telephone Encounter (Signed)
Please contact patient and reschedule her lab visit for a vit D recheck.   She came up in my reminder box in Epic.

## 2019-09-30 NOTE — Telephone Encounter (Signed)
Call placed to pt. Pt given recommendations per Dr Edward Jolly. Pt agreeable. Pt scheduled for vit D level 6/16 at 345 pm. Pt agreeable and verbalized understanding. Future orders in place.  Routing to Dr Edward Jolly for review.  Encounter closed

## 2019-10-01 ENCOUNTER — Other Ambulatory Visit (INDEPENDENT_AMBULATORY_CARE_PROVIDER_SITE_OTHER): Payer: BC Managed Care – PPO

## 2019-10-01 ENCOUNTER — Other Ambulatory Visit: Payer: Self-pay

## 2019-10-01 DIAGNOSIS — R7989 Other specified abnormal findings of blood chemistry: Secondary | ICD-10-CM

## 2019-10-02 LAB — VITAMIN D 25 HYDROXY (VIT D DEFICIENCY, FRACTURES): Vit D, 25-Hydroxy: 20.8 ng/mL — ABNORMAL LOW (ref 30.0–100.0)

## 2019-10-06 ENCOUNTER — Telehealth: Payer: Self-pay

## 2019-10-06 DIAGNOSIS — R7989 Other specified abnormal findings of blood chemistry: Secondary | ICD-10-CM

## 2019-10-06 NOTE — Telephone Encounter (Signed)
-----   Message from Patton Salles, MD sent at 10/04/2019  8:13 AM EDT ----- Please contact patient with results of testing.  You may need a [place] interpretor.   Her vitamin D level is still low at 20.8.  I am recommending she take the vitamin D 50,000 IU twice a week.  She will need a new Rx.  Please make a follow up lab appointment for 3 months.  I will place a future order.

## 2019-10-06 NOTE — Telephone Encounter (Signed)
Left message for pt to return call to triage RN. 

## 2019-10-10 ENCOUNTER — Other Ambulatory Visit: Payer: Self-pay

## 2019-10-10 ENCOUNTER — Telehealth: Payer: Self-pay

## 2019-10-10 ENCOUNTER — Ambulatory Visit (HOSPITAL_COMMUNITY)
Admission: EM | Admit: 2019-10-10 | Discharge: 2019-10-10 | Disposition: A | Discharge status: Home / Self Care | Payer: BC Managed Care – PPO | Attending: Family Medicine | Admitting: Family Medicine

## 2019-10-10 ENCOUNTER — Encounter (HOSPITAL_COMMUNITY): Payer: Self-pay

## 2019-10-10 DIAGNOSIS — N814 Uterovaginal prolapse, unspecified: Secondary | ICD-10-CM | POA: Diagnosis not present

## 2019-10-10 DIAGNOSIS — Z3202 Encounter for pregnancy test, result negative: Secondary | ICD-10-CM

## 2019-10-10 DIAGNOSIS — R109 Unspecified abdominal pain: Secondary | ICD-10-CM | POA: Diagnosis not present

## 2019-10-10 DIAGNOSIS — R1030 Lower abdominal pain, unspecified: Secondary | ICD-10-CM | POA: Diagnosis present

## 2019-10-10 LAB — POCT URINALYSIS DIP (DEVICE)
Bilirubin Urine: NEGATIVE
Glucose, UA: NEGATIVE mg/dL
Ketones, ur: NEGATIVE mg/dL
Leukocytes,Ua: NEGATIVE
Nitrite: NEGATIVE
Protein, ur: NEGATIVE mg/dL
Specific Gravity, Urine: 1.02 (ref 1.005–1.030)
Urobilinogen, UA: 0.2 mg/dL (ref 0.0–1.0)
pH: 6 (ref 5.0–8.0)

## 2019-10-10 LAB — POC URINE PREG, ED: Preg Test, Ur: NEGATIVE

## 2019-10-10 MED ORDER — KETOROLAC TROMETHAMINE 30 MG/ML IJ SOLN
30.0000 mg | Freq: Once | INTRAMUSCULAR | Status: AC
  Administered 2019-10-10: 30 mg via INTRAMUSCULAR

## 2019-10-10 MED ORDER — VITAMIN D (ERGOCALCIFEROL) 1.25 MG (50000 UNIT) PO CAPS: 50000.0000 [IU] | ORAL_CAPSULE | ORAL | 0 refills | Status: AC

## 2019-10-10 MED ORDER — KETOROLAC TROMETHAMINE 30 MG/ML IJ SOLN
INTRAMUSCULAR | Status: AC
  Filled 2019-10-10: qty 1

## 2019-10-10 NOTE — Discharge Instructions (Addendum)
Based on your exam I believe that you have a uterine prolapse.   Toradol given here for pain. Please see your doctor on Monday as planned.  If your symptoms continue or worsen to include more severe pain through the weekend you will need to go to the ER. You can take ibuprofen at home for pain.  600 mg every 8 hours. Follow up as needed for continued or worsening symptoms

## 2019-10-10 NOTE — ED Triage Notes (Signed)
Pt presents with abdominal cramps and pressure since this morning. Pt states she had 3 menstrual period in this month.

## 2019-10-10 NOTE — Telephone Encounter (Signed)
Spoke with Brandy Turner. Brandy Turner states having severe constant cramping x 30 mins while driving this morning, came back home and called our office.  Brandy Turner states feeling nauseated. Denies fever, chills, vomiting or diarrhea. Denies heavy vaginal bleeding or clots while having cramping. Denies taking any OTC medications at this time for pain. Brandy Turner rates pain as 8-9 on pain scale. Regular BM, last one yesterday. Still taking Micronor Rx and has not skipped or missed any pills. No SA at this time.   Brandy Turner states also had 3 periods this month: 5/28-6/2 6/9-6/17 6/17-6/21. Brandy Turner states having no cramping and no heavy bleeding or clots with cycles.   Advised to be seen at Urgent care or ER since constant abd cramps and pain. Brandy Turner agreeable and verbalized understanding. Brandy Turner does not have PCP. Brandy Turner advised will review with Dr Edward Jolly and return call to Brandy Turner if needs follow up appt for irregular cycles or any further recommendations  with Dr Edward Jolly. Brandy Turner agreeable.   Routing to Dr Edward Jolly.

## 2019-10-10 NOTE — Telephone Encounter (Signed)
I agree with evaluation in the ER or urgent care for her pain.  Please check back with her later to see how she is doing and schedule follow up with me for next week.

## 2019-10-10 NOTE — Telephone Encounter (Signed)
Call placed to pt. Pt states going to Urgent Care and being seen for uterine prolapse. Pt states was given Toradol for pain at Urgent care and is feeling no pain now. Pt states was to have follow up visit with Dr Edward Jolly next week and to go back to ER if new or worsening symptoms and to take Ibuprofen as needed for pain.  Pt agreeable and verbalized understanding. Pt denies having any straining with any recent BM or lifting any heavy weights.    Pt advised for OV. Pt scheduled 10/15/19 at 930 am with Dr Edward Jolly as follow up. Offered earlier appts, but declines due to work schedule and other appts on Monday. Advised will review with Dr Edward Jolly and return call with any further recommendations. Pt agreeable.   Routing to Dr Edward Jolly.

## 2019-10-10 NOTE — Telephone Encounter (Signed)
Spoke with pt. Pt given results and recommendations per Dr Edward Jolly. Pt agreeable and verbalized understanding.  Rx sent to pharmacy on file.  Pt to call back to make appt for 3 month Vit D level due to having abd pain/cramps today. See phone encounter dated 10/10/19.  Routing to Dr Edward Jolly for review.  Encounter closed.  3 month recall placed for Vit D level.

## 2019-10-10 NOTE — Telephone Encounter (Signed)
Patient is calling in regards to irregular bleeding and "extreme" cramping.

## 2019-10-11 NOTE — ED Provider Notes (Signed)
[facility]    CSN: [id number] Arrival date & time: 10/10/19  1011      History   Chief Complaint Chief Complaint  Patient presents with  . Abdominal Cramping    HPI Akayla Brass is a 29 y.o. female.   Pt is a 29 year old female that presents with lower abdominal cramping, vaginal pressure since this am. Symptoms have been, constant, waxing and waning. Has had 3 menstrual cycles this month and spotting. currently sexually active with one partner unprotected. Not concerned for STDs. No specific vaginal discharge, dysuria, hematuria or urinary frequency. No fever.   ROS per HPI      Past Medical History:  Diagnosis Date  . Anxiety   . Dysmenorrhea   . Migraines    with aura    There are no problems to display for this patient.   Past Surgical History:  Procedure Laterality Date  . CESAREAN SECTION  2014   in [place]    OB History    Gravida  1   Para  1   Term  1   Preterm      AB      Living  1     SAB      TAB      Ectopic      Multiple      Live Births               Home Medications    Prior to Admission medications   Medication Sig Start Date End Date Taking? Authorizing Provider  norethindrone (MICRONOR) 0.35 MG tablet Take 1 tablet (0.35 mg total) by mouth daily. 05/26/19   Nunzio Cobbs, MD  Vitamin D, Ergocalciferol, (DRISDOL) 1.25 MG (50000 UNIT) CAPS capsule Take 1 capsule (50,000 Units total) by mouth 2 (two) times a week. 10/13/19 01/11/20  Nunzio Cobbs, MD    Family History Family History  Problem Relation Age of Onset  . Hypertension Mother   . Heart disease Mother     Social History Social History   Tobacco Use  . Smoking status: Never Smoker  . Smokeless tobacco: Never Used  Vaping Use  . Vaping Use: Never used  Substance Use Topics  . Alcohol use: Yes    Alcohol/week: 0.0 - 1.0 standard drinks    Comment: occ  . Drug use: Never     Allergies   Patient has no known  allergies.   Review of Systems Review of Systems   Physical Exam Triage Vital Signs ED Triage Vitals  Enc Vitals Group     BP 10/10/19 1031 122/75     Pulse Rate 10/10/19 1031 75     Resp 10/10/19 1031 17     Temp 10/10/19 1031 99.3 F (37.4 C)     Temp Source 10/10/19 1031 Oral     SpO2 10/10/19 1031 99 %     Weight --      Height --      Head Circumference --      Peak Flow --      Pain Score 10/10/19 1030 6     Pain Loc --      Pain Edu? --      Excl. in [place]? --    No data found.  Updated Vital Signs BP 122/75 (BP Location: Left Arm)   Pulse 75   Temp 99.3 F (37.4 C) (Oral)   Resp 17   LMP  (Within Days)  SpO2 99%   Visual Acuity Right Eye Distance:   Left Eye Distance:   Bilateral Distance:    Right Eye Near:   Left Eye Near:    Bilateral Near:     Physical Exam Vitals and nursing note reviewed.  Constitutional:      General: She is not in acute distress.    Appearance: Normal appearance. She is not ill-appearing, toxic-appearing or diaphoretic.  HENT:     Head: Normocephalic.     Nose: Nose normal.     Mouth/Throat:     Pharynx: Oropharynx is clear.  Eyes:     Conjunctiva/sclera: Conjunctivae normal.  Pulmonary:     Effort: Pulmonary effort is normal.  Abdominal:     Palpations: Abdomen is soft.     Tenderness: There is abdominal tenderness.     Comments: Generalized lower abdominal tenderness   Genitourinary:    Comments: External vaginal exam with uterus protruding from vaginal opening. Inserted speculum gently and pushed uterus back in place . Able to view cervix. Cervix low and unable to view cervical os. Dark brown discharge. No CMT.  Musculoskeletal:        General: Normal range of motion.     Cervical back: Normal range of motion.  Skin:    General: Skin is warm and dry.     Findings: No rash.  Neurological:     Mental Status: She is alert.  Psychiatric:        Mood and Affect: Mood normal.      UC Treatments / Results    Labs (all labs ordered are listed, but only abnormal results are displayed) Labs Reviewed  POCT URINALYSIS DIP (DEVICE) - Abnormal; Notable for the following components:      Result Value   Hgb urine dipstick TRACE (*)    All other components within normal limits  POC URINE PREG, ED  CERVICOVAGINAL ANCILLARY ONLY    EKG   Radiology No results found.  Procedures Procedures (including critical care time)  Medications Ordered in UC Medications  ketorolac (TORADOL) 30 MG/ML injection 30 mg (30 mg Intramuscular Given 10/10/19 1116)    Initial Impression / Assessment and Plan / UC Course  I have reviewed the triage vital signs and the nursing notes.  Pertinent labs & imaging results that were available during my care of the patient were reviewed by me and considered in my medical decision making (see chart for details).     Uterine prolapse Pt feeling better after pelvic exam without much pain and pushing the uterus back in place.  Not able to view cervical OS but no CMT Dark discharge most likely from menstrual; bleeding.  Toradol given for pain.  Recommend see OB/GYN on Monday.  ER or return to [facility] for continued issues.  Final Clinical Impressions(s) / UC Diagnoses   Final diagnoses:  Uterine prolapse     Discharge Instructions     Based on your exam I believe that you have a uterine prolapse.   Toradol given here for pain. Please see your doctor on Monday as planned.  If your symptoms continue or worsen to include more severe pain through the weekend you will need to go to the ER. You can take ibuprofen at home for pain.  600 mg every 8 hours. Follow up as needed for continued or worsening symptoms     ED Prescriptions    None     PDMP not reviewed this encounter.   Janace Aris, NP  10/11/19 1151  

## 2019-10-12 NOTE — Telephone Encounter (Signed)
Thank you for the follow up.  You may close the encounter.

## 2019-10-13 LAB — CERVICOVAGINAL ANCILLARY ONLY
Bacterial Vaginitis (gardnerella): NEGATIVE
Candida Glabrata: NEGATIVE
Candida Vaginitis: NEGATIVE
Chlamydia: NEGATIVE
Comment: NEGATIVE
Comment: NEGATIVE
Comment: NEGATIVE
Comment: NEGATIVE
Comment: NEGATIVE
Comment: NORMAL
Neisseria Gonorrhea: NEGATIVE
Trichomonas: NEGATIVE

## 2019-10-14 NOTE — Progress Notes (Signed)
GYNECOLOGY  VISIT   HPI: 29 y.o.   Married  [place]  female   [phone] with Patient's last menstrual period was 10/04/2019 (exact date).   here for follow up from ER for uterine prolapse. She had a negative UPT there but is still concerned about potential pregnancy.  Having a lot of cramping.  She is having some pain in her anal region.   She had 3 cycles this month of June.  No missed pills.   Interested in child bearing in one year.  Urinary frequency, up to every 45 minutes.  Can leak urine with cough or laugh.  Her underwear are always wet, but does not feel the leakage.  BM every 2 -3  Days. No splinting.  Feels something coming out of the vagina.  Having headaches.   Sexually active and pain with intercourse. No partner change.   She is doing treatment with psychologist/psychiatrist in [place].  They are suggesting return to Prozac treatment, which she took last 3.5 years ago.   She is not treating here because she does not have a prescriber. She is having anxiety and not depression. She feels nervous about getting things done.  Denies suicidal ideation.  Had this 6 - 7 years ago.  She is not sleeping well.   GYNECOLOGIC HISTORY: Patient's last menstrual period was 10/04/2019 (exact date). Contraception:  POP Menopausal hormone therapy:  none Last mammogram: None Last pap smear:  04/2017 Neg per patient        OB History    Gravida  1   Para  1   Term  1   Preterm      AB      Living  1     SAB      TAB      Ectopic      Multiple      Live Births                 There are no problems to display for this patient.   Past Medical History:  Diagnosis Date  . Anxiety   . Dysmenorrhea   . Migraines    with aura    Past Surgical History:  Procedure Laterality Date  . CESAREAN SECTION  2014   in [place]    Current Outpatient Medications  Medication Sig Dispense Refill  . norethindrone (MICRONOR) 0.35 MG tablet Take 1 tablet (0.35  mg total) by mouth daily. 3 Package 3  . Vitamin D, Ergocalciferol, (DRISDOL) 1.25 MG (50000 UNIT) CAPS capsule Take 1 capsule (50,000 Units total) by mouth 2 (two) times a week. (Patient not taking: Reported on 10/15/2019) 24 capsule 0   No current facility-administered medications for this visit.     ALLERGIES: Patient has no known allergies.  Family History  Problem Relation Age of Onset  . Hypertension Mother   . Heart disease Mother     Social History   Socioeconomic History  . Marital status: Married    Spouse name: Not on file  . Number of children: Not on file  . Years of education: Not on file  . Highest education level: Not on file  Occupational History  . Not on file  Tobacco Use  . Smoking status: Never Smoker  . Smokeless tobacco: Never Used  Vaping Use  . Vaping Use: Never used  Substance and Sexual Activity  . Alcohol use: Yes    Alcohol/week: 0.0 - 1.0 standard drinks    Comment: occ  .  Drug use: Never  . Sexual activity: Yes    Partners: Male    Birth control/protection: Pill  Other Topics Concern  . Not on file  Social History Narrative  . Not on file   Social Determinants of Health   Financial Resource Strain:   . Difficulty of Paying Living Expenses:   Food Insecurity:   . Worried About Programme researcher, broadcasting/film/video in the Last Year:   . Barista in the Last Year:   Transportation Needs:   . Freight forwarder (Medical):   Marland Kitchen Lack of Transportation (Non-Medical):   Physical Activity:   . Days of Exercise per Week:   . Minutes of Exercise per Session:   Stress:   . Feeling of Stress :   Social Connections:   . Frequency of Communication with Friends and Family:   . Frequency of Social Gatherings with Friends and Family:   . Attends Religious Services:   . Active Member of Clubs or Organizations:   . Attends Banker Meetings:   Marland Kitchen Marital Status:   Intimate Partner Violence:   . Fear of Current or Ex-Partner:   .  Emotionally Abused:   Marland Kitchen Physically Abused:   . Sexually Abused:     Review of Systems  All other systems reviewed and are negative.   PHYSICAL EXAMINATION:    BP 100/66   Pulse 64   Ht 5' 4.5" (1.638 m)   Wt 158 lb 9.6 oz (71.9 kg)   LMP 10/04/2019 (Exact Date)   BMI 26.80 kg/m     General appearance: alert, cooperative and appears stated age    Pelvic: External genitalia:  no lesions              Urethra:  normal appearing urethra with no masses, tenderness or lesions              Bartholins and Skenes: normal                 Vagina: normal appearing vagina with normal color and discharge, no lesions.  Prominent hymenal folds - normal anatomy.               Cervix: no lesions.  Brown blood noted.                 Bimanual Exam:  Uterus:  normal size, contour, position, consistency, mobility, non-tender              Adnexa: no mass, fullness, tenderness on left.  Right adnexal firmness and mass 3 cm, nontender.             Examined sitting and standing.  No prolapse noted.   Chaperone was present for exam.  ASSESSMENT  No pelvic organ prolapse.  Hx Cesarean Section.  Right adnexal mass.  Irregular bleeding.  On Micronor.  Anxiety.   PLAN  We discussed pelvic organ prolapse and her normal anatomy today.  Will check hCG quant. Continue Micronor.  Return for pelvic ultrasound.  Start Prozac 10 mg. Instructed in use and potential side effects.  Will reassess dosage as needed.    An After Visit Summary was printed and given to the patient.  __41____ minutes consultation time.

## 2019-10-15 ENCOUNTER — Other Ambulatory Visit: Payer: Self-pay

## 2019-10-15 ENCOUNTER — Telehealth: Payer: Self-pay | Admitting: Obstetrics and Gynecology

## 2019-10-15 ENCOUNTER — Ambulatory Visit: Payer: BC Managed Care – PPO | Admitting: Obstetrics and Gynecology

## 2019-10-15 ENCOUNTER — Telehealth: Payer: Self-pay | Admitting: *Deleted

## 2019-10-15 ENCOUNTER — Other Ambulatory Visit: Payer: Self-pay | Admitting: *Deleted

## 2019-10-15 ENCOUNTER — Encounter: Payer: Self-pay | Admitting: Obstetrics and Gynecology

## 2019-10-15 VITALS — BP 100/66 | HR 64 | Ht 64.5 in | Wt 158.6 lb

## 2019-10-15 DIAGNOSIS — N926 Irregular menstruation, unspecified: Secondary | ICD-10-CM | POA: Diagnosis not present

## 2019-10-15 DIAGNOSIS — N9489 Other specified conditions associated with female genital organs and menstrual cycle: Secondary | ICD-10-CM | POA: Diagnosis not present

## 2019-10-15 DIAGNOSIS — F419 Anxiety disorder, unspecified: Secondary | ICD-10-CM | POA: Diagnosis not present

## 2019-10-15 LAB — BETA HCG QUANT (REF LAB): hCG Quant: <1 m[IU]/mL

## 2019-10-15 MED ORDER — FLUOXETINE HCL 10 MG PO TABS: 10.0000 mg | ORAL_TABLET | Freq: Every day | ORAL | 1 refills | Status: DC

## 2019-10-15 NOTE — Telephone Encounter (Signed)
Call to patient. Per DPR, OK to leave message on voicemail.   Left voicemail requesting a return call to [place] to review benefits and schedule recommended Pelvic ultrasound with Brook A. Pommier, MD, FACOG 

## 2019-10-15 NOTE — Telephone Encounter (Signed)
PA request received from [facility] for Prozac 10 mg tabs.  PA submitted to plan via [email or web address] Dx: Anxiety Key: [id number] - PA Case ID: [id number] - Rx #: [id number]  Patient notified. Reviewed option of using savings coupon if not covered by plan. Will notify patient of response once received from plan. Patient verbalizes understanding and is agreeable.

## 2019-10-15 NOTE — Telephone Encounter (Signed)
Left message to call Noreene Larsson, RN at [facility] [phone].    PA denied Fluoxetine hcl 10mg  tab.  See fax notification from [employer] for covered alternatives.  Patient also has option to pay out of pocket and use savings coupon.

## 2019-10-15 NOTE — Telephone Encounter (Signed)
-----   Message from Patton Salles, MD sent at 10/15/2019  1:45 PM EDT ----- Please let patient know that her blood pregnancy test is negative.

## 2019-10-16 NOTE — Telephone Encounter (Signed)
Patient returned call to office 

## 2019-10-16 NOTE — Telephone Encounter (Signed)
Spoke with patient. Advised of results and PA response for Prozac. Patient is going to try [employer] savings coupon for now, will return call to office if alternative is needed. Patient verbalizes understanding and is agreeable.   Call transferred to business office.    Routing to [employer]

## 2019-10-16 NOTE — Telephone Encounter (Signed)
Patient returning call to office. Patient also needs to speak with business office for benefits.

## 2019-10-16 NOTE — Telephone Encounter (Signed)
Left message to call Jailon Schaible, RN at [facility] [phone].   

## 2019-10-16 NOTE — Telephone Encounter (Signed)
Spoke with patient regarding benefits for recommended ultrasound. Patient is aware that ultrasound is transvaginal. Patient acknowledges understanding of information presented. Patient is aware of cancellation policy. Patient scheduled appointment for 10/23/2019 at 0800AM with Brook A. Edward Jolly, MD, [facility]. Encounter closed.

## 2019-10-23 ENCOUNTER — Ambulatory Visit (INDEPENDENT_AMBULATORY_CARE_PROVIDER_SITE_OTHER): Payer: BC Managed Care – PPO | Admitting: Obstetrics and Gynecology

## 2019-10-23 ENCOUNTER — Encounter: Payer: Self-pay | Admitting: Obstetrics and Gynecology

## 2019-10-23 ENCOUNTER — Ambulatory Visit (INDEPENDENT_AMBULATORY_CARE_PROVIDER_SITE_OTHER): Payer: BC Managed Care – PPO

## 2019-10-23 ENCOUNTER — Other Ambulatory Visit: Payer: Self-pay

## 2019-10-23 VITALS — BP 100/58 | HR 70 | Ht 64.5 in | Wt 158.0 lb

## 2019-10-23 DIAGNOSIS — N941 Unspecified dyspareunia: Secondary | ICD-10-CM

## 2019-10-23 DIAGNOSIS — R102 Pelvic and perineal pain: Secondary | ICD-10-CM

## 2019-10-23 DIAGNOSIS — N926 Irregular menstruation, unspecified: Secondary | ICD-10-CM | POA: Diagnosis not present

## 2019-10-23 DIAGNOSIS — N9489 Other specified conditions associated with female genital organs and menstrual cycle: Secondary | ICD-10-CM | POA: Diagnosis not present

## 2019-10-23 DIAGNOSIS — K5900 Constipation, unspecified: Secondary | ICD-10-CM

## 2019-10-23 NOTE — Progress Notes (Signed)
GYNECOLOGY  VISIT   HPI: 29 y.o.   Married  [place]  female   [phone] with Patient's last menstrual period was 10/17/2019 (exact date).   here for pelvic ultrasound.  She had an episode of pelvic cramping.  Reports painful intercourse. She has swelling in her abdomen but no pain.  Her cycles are skipped on her current POP.  On physical exam on 10/15/19, she had a 3 cm nontender right adnexal mass.  Bowel function almost daily. She does need to strain most of the time.   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 She has gained weight since moving to the [place] and is working on weight loss.   She will start on Prozac for anxiety. She has used this in the past in [place]. She lost weight when she took this because it helped her stress eating.   GYNECOLOGIC HISTORY: Patient's last menstrual period was 10/17/2019 (exact date). Contraception: POP Menopausal hormone therapy:  none Last mammogram:  n/a Last pap smear:  04/2017 Neg per patient        OB History    Gravida  1   Para  1   Term  1   Preterm      AB      Living  1     SAB      TAB      Ectopic      Multiple      Live Births                 There are no problems to display for this patient.   Past Medical History:  Diagnosis Date  . Anxiety   . Dysmenorrhea   . Migraines    with aura    Past Surgical History:  Procedure Laterality Date  . CESAREAN SECTION  2014   in [place]    Current Outpatient Medications  Medication Sig Dispense Refill  . norethindrone (MICRONOR) 0.35 MG tablet Take 1 tablet (0.35 mg total) by mouth daily. 3 Package 3  . Vitamin D, Ergocalciferol, (DRISDOL) 1.25 MG (50000 UNIT) CAPS capsule Take 1 capsule (50,000 Units total) by mouth 2 (two) times a week. 24 capsule 0  . FLUoxetine (PROZAC) 10 MG tablet Take 1 tablet (10 mg total) by mouth daily. (Patient not taking: Reported on 10/23/2019) 30 tablet 1   No current facility-administered medications for this  visit.     ALLERGIES: Patient has no known allergies.  Family History  Problem Relation Age of Onset  . Hypertension Mother   . Heart disease Mother     Social History   Socioeconomic History  . Marital status: Married    Spouse name: Not on file  . Number of children: Not on file  . Years of education: Not on file  . Highest education level: Not on file  Occupational History  . Not on file  Tobacco Use  . Smoking status: Never Smoker  . Smokeless tobacco: Never Used  Vaping Use  . Vaping Use: Never used  Substance and Sexual Activity  . Alcohol use: Yes    Alcohol/week: 0.0 - 1.0 standard drinks    Comment: occ  . Drug use: Never  . Sexual activity: Yes    Partners: Male    Birth control/protection: Pill  Other Topics Concern  . Not on file  Social History Narrative  . Not on file   Social Determinants of Health   Financial Resource Strain:   . Difficulty of Paying  Living Expenses:   Food Insecurity:   . Worried About Programme researcher, broadcasting/film/video in the Last Year:   . Barista in the Last Year:   Transportation Needs:   . Freight forwarder (Medical):   Marland Kitchen Lack of Transportation (Non-Medical):   Physical Activity:   . Days of Exercise per Week:   . Minutes of Exercise per Session:   Stress:   . Feeling of Stress :   Social Connections:   . Frequency of Communication with Friends and Family:   . Frequency of Social Gatherings with Friends and Family:   . Attends Religious Services:   . Active Member of Clubs or Organizations:   . Attends Banker Meetings:   Marland Kitchen Marital Status:   Intimate Partner Violence:   . Fear of Current or Ex-Partner:   . Emotionally Abused:   Marland Kitchen Physically Abused:   . Sexually Abused:     Review of Systems  All other systems reviewed and are negative.   PHYSICAL EXAMINATION:    BP (!) 100/58   Pulse 70   Ht 5' 4.5" (1.638 m)   Wt 158 lb (71.7 kg)   LMP 10/17/2019 (Exact Date)   BMI 26.70 kg/m       General appearance: alert, cooperative and appears stated age   Pelvic US Uterus no myometrial masses.  EMS 2.19 mm.  Ovaries normal.  No free fluid.  ASSESSMENT  Pelvic cramping.  Dyspareunia. Amenorrhea on POPs.  Desire for future fertility.  Hx C/S. Normal pelvic ultrasound.  Straining with bowel movements.  Anxiety.   PLAN  Pelvic ultrasound images and report reviewed and reassurance given. We discussed adhesive disease and endometriosis as possible reasons for pelvic pain.  Continue POPs.  We discussed laparoscopy if her pelvic pain and pain with intercourse persist/increase. Constipation reviewed.  She may start a probiotic and consider Colace.  She already is familiar with a healthy diet.  She will start Prozac.  Fu prn.   ___30___ minutes consultation.

## 2020-02-22 ENCOUNTER — Telehealth: Payer: Self-pay | Admitting: Obstetrics and Gynecology

## 2020-02-22 DIAGNOSIS — E559 Vitamin D deficiency, unspecified: Secondary | ICD-10-CM

## 2020-02-22 NOTE — Telephone Encounter (Signed)
Please contact patient to have her come in for a lab check of her vit D level.  She come up in my reminder box in Epic.

## 2020-02-23 NOTE — Telephone Encounter (Signed)
Left message for pt to return call to triage RN. 

## 2020-02-24 NOTE — Telephone Encounter (Signed)
Spoke with pt. Pt states has not been taking consistently. Pt states not taking every day. Pt does not have her bottle with her at work to confirm Rx that was sent in 09/2019 to pharmacy on file. Pt is suppose to take Vit D 50K twice a week.  Pt to confirm Rx. Will return call to pt on 11/10 morning.

## 2020-02-25 NOTE — Telephone Encounter (Signed)
Left message for pt to return call to triage RN. 

## 2020-02-26 ENCOUNTER — Ambulatory Visit: Payer: BC Managed Care – PPO | Admitting: Obstetrics and Gynecology

## 2020-02-26 ENCOUNTER — Encounter: Payer: Self-pay | Admitting: Obstetrics and Gynecology

## 2020-02-26 ENCOUNTER — Other Ambulatory Visit: Payer: Self-pay

## 2020-02-26 ENCOUNTER — Telehealth: Payer: Self-pay

## 2020-02-26 VITALS — BP 112/58 | HR 72 | Resp 16 | Ht 64.5 in | Wt 153.0 lb

## 2020-02-26 DIAGNOSIS — N926 Irregular menstruation, unspecified: Secondary | ICD-10-CM

## 2020-02-26 DIAGNOSIS — R42 Dizziness and giddiness: Secondary | ICD-10-CM | POA: Diagnosis not present

## 2020-02-26 DIAGNOSIS — R7989 Other specified abnormal findings of blood chemistry: Secondary | ICD-10-CM

## 2020-02-26 LAB — POCT URINE PREGNANCY: Preg Test, Ur: NEGATIVE

## 2020-02-26 NOTE — Telephone Encounter (Signed)
Spoke with patient and advised per Dr.Shelley come in today for office visit at 2:00 for work in appointment.

## 2020-02-26 NOTE — Telephone Encounter (Signed)
Spoke with patient. She states LMP 02-11-20 to 11-2-21normal cycle. She began bleeding again on 02-23-20 like a regular cycle and still bleeding like a normal cycle. Denies any cramping and passing any clots, but states bleeding red like normal menses again. She states she is feeling a little dizzy and fatigue. Will discuss with Dr.Siegman and call her back with recommendations.  BC--Norlyda Last AEX 05-26-19

## 2020-02-26 NOTE — Telephone Encounter (Signed)
Patient's regular cycle went off last Tuesday and she started bleeding again heavier on Monday.

## 2020-02-26 NOTE — Telephone Encounter (Signed)
Pt did not return call for vit D. Pt has appt with Dr Edward Jolly 02/26/20. Encounter closed

## 2020-02-26 NOTE — Progress Notes (Signed)
GYNECOLOGY  VISIT   HPI: 29 y.o.   Married  [place]  female   [phone] with Patient's last menstrual period was 02/23/2020.   here for irregular bleeding. Per patient had a period from 02/11/20 - 02/17/20 and started bleeding again 02/23/20 with bright red blood, dizziness, some fatigue, bloating, back pain, and headache.   Changes tampon every 2 - 3 hours for freshness, not always soaked.  Bleeding has since improved. She had some dizziness, lightheadedness.  Some swelling. No significant pelvic pain, but has some discomfort. Some light lower back pain. No pain medication needed.   Taking her birth control pills every morning, 5:30 or 6:30 am.  Sometimes 9:00 am.     Had a stressful time at work prior to bleeding restarting.  Was not eating or taking care of herself well.  Normal pelvic US 10/23/19.  Concerned about pregnancy.   She had low vit D and she took her own vit D OTC instead of prescription strength.   UPT: Negative  Traveling to [place] in December.   GYNECOLOGIC HISTORY: Patient's last menstrual period was 02/23/2020. Contraception:  POP Menopausal hormone therapy:  none Last mammogram:  n/a Last pap smear:   2019 Negative patient        OB History    Gravida  1   Para  1   Term  1   Preterm      AB      Living  1     SAB      TAB      Ectopic      Multiple      Live Births                 There are no problems to display for this patient.   Past Medical History:  Diagnosis Date  . Anxiety   . Dysmenorrhea   . Migraines    with aura    Past Surgical History:  Procedure Laterality Date  . CESAREAN SECTION  2014   in [place]    Current Outpatient Medications  Medication Sig Dispense Refill  . norethindrone (MICRONOR) 0.35 MG tablet Take 1 tablet (0.35 mg total) by mouth daily. 3 Package 3   No current facility-administered medications for this visit.     ALLERGIES: Patient has no known allergies.  Family History   Problem Relation Age of Onset  . Hypertension Mother   . Heart disease Mother     Social History   Socioeconomic History  . Marital status: Married    Spouse name: Not on file  . Number of children: Not on file  . Years of education: Not on file  . Highest education level: Not on file  Occupational History  . Not on file  Tobacco Use  . Smoking status: Never Smoker  . Smokeless tobacco: Never Used  Vaping Use  . Vaping Use: Never used  Substance and Sexual Activity  . Alcohol use: Yes    Alcohol/week: 0.0 - 1.0 standard drinks    Comment: occ  . Drug use: Never  . Sexual activity: Yes    Partners: Male    Birth control/protection: Pill  Other Topics Concern  . Not on file  Social History Narrative  . Not on file   Social Determinants of Health   Financial Resource Strain:   . Difficulty of Paying Living Expenses: Not on file  Food Insecurity:   . Worried About Programme researcher, broadcasting/film/video in the Last Year:  Not on file  . Ran Out of Food in the Last Year: Not on file  Transportation Needs:   . Lack of Transportation (Medical): Not on file  . Lack of Transportation (Non-Medical): Not on file  Physical Activity:   . Days of Exercise per Week: Not on file  . Minutes of Exercise per Session: Not on file  Stress:   . Feeling of Stress : Not on file  Social Connections:   . Frequency of Communication with Friends and Family: Not on file  . Frequency of Social Gatherings with Friends and Family: Not on file  . Attends Religious Services: Not on file  . Active Member of Clubs or Organizations: Not on file  . Attends Banker Meetings: Not on file  . Marital Status: Not on file  Intimate Partner Violence:   . Fear of Current or Ex-Partner: Not on file  . Emotionally Abused: Not on file  . Physically Abused: Not on file  . Sexually Abused: Not on file    Review of Systems  Constitutional: Positive for fatigue.  HENT: Negative.   Eyes: Negative.    Respiratory: Negative.   Cardiovascular: Negative.   Gastrointestinal:       Bloating   Endocrine: Negative.   Genitourinary: Positive for vaginal bleeding.  Musculoskeletal: Positive for back pain.  Skin: Negative.   Allergic/Immunologic: Negative.   Neurological: Positive for dizziness and headaches.  Hematological: Negative.   Psychiatric/Behavioral: Negative.     PHYSICAL EXAMINATION:    BP (!) 112/58 (BP Location: Right Arm, Patient Position: Sitting, Cuff Size: Normal)   Pulse 72   Resp 16   Ht 5' 4.5" (1.638 m)   Wt 153 lb (69.4 kg)   LMP 02/23/2020   BMI 25.86 kg/m     General appearance: alert, cooperative and appears stated age  Pelvic: External genitalia:  no lesions              Urethra:  normal appearing urethra with no masses, tenderness or lesions              Bartholins and Skenes: normal                 Vagina: normal appearing vagina with normal color and discharge, no lesions              Cervix: no lesions                Bimanual Exam:  Uterus:  normal size, contour, position, consistency, mobility, non-tender              Adnexa: no mass, fullness, tenderness               Chaperone was present for exam.  ASSESSMENT  Irregular menses.  Dizziness.  Low vit D.  PLAN  Take POPs same time daily.  Will check TSH, CBC, CMP, vit D and serum hCG.  I encouraged self care with regular meals and hydration.  May need prescription vit D. Fu if irregular bleeding persists.   18 min  total time was spent for this patient encounter, including preparation, face-to-face counseling with the patient, coordination of care, and documentation of the encounter.

## 2020-02-27 ENCOUNTER — Telehealth: Payer: Self-pay | Admitting: *Deleted

## 2020-02-27 LAB — COMPREHENSIVE METABOLIC PANEL
ALT: 12 IU/L (ref 0–32)
AST: 17 IU/L (ref 0–40)
Albumin/Globulin Ratio: 2 (ref 1.2–2.2)
Albumin: 4.7 g/dL (ref 3.9–5.0)
Alkaline Phosphatase: 89 IU/L (ref 44–121)
BUN/Creatinine Ratio: 9 (ref 9–23)
BUN: 8 mg/dL (ref 6–20)
Bilirubin Total: <0.2 mg/dL (ref 0.0–1.2)
CO2: 22 mmol/L (ref 20–29)
Calcium: 9.4 mg/dL (ref 8.7–10.2)
Chloride: 100 mmol/L (ref 96–106)
Creatinine, Ser: 0.86 mg/dL (ref 0.57–1.00)
GFR calc Af Amer: 105 mL/min/{1.73_m2} (ref >59)
GFR calc non Af Amer: 91 mL/min/{1.73_m2} (ref >59)
Globulin, Total: 2.3 g/dL (ref 1.5–4.5)
Glucose: 91 mg/dL (ref 65–99)
Potassium: 3.8 mmol/L (ref 3.5–5.2)
Sodium: 141 mmol/L (ref 134–144)
Total Protein: 7 g/dL (ref 6.0–8.5)

## 2020-02-27 LAB — CBC
Hematocrit: 38.6 % (ref 34.0–46.6)
Hemoglobin: 12.7 g/dL (ref 11.1–15.9)
MCH: 29.3 pg (ref 26.6–33.0)
MCHC: 32.9 g/dL (ref 31.5–35.7)
MCV: 89 fL (ref 79–97)
Platelets: 363 10*3/uL (ref 150–450)
RBC: 4.34 x10E6/uL (ref 3.77–5.28)
RDW: 12.6 % (ref 11.7–15.4)
WBC: 6.8 10*3/uL (ref 3.4–10.8)

## 2020-02-27 LAB — BETA HCG QUANT (REF LAB): hCG Quant: <1 m[IU]/mL

## 2020-02-27 LAB — VITAMIN D 25 HYDROXY (VIT D DEFICIENCY, FRACTURES): Vit D, 25-Hydroxy: 15.3 ng/mL — ABNORMAL LOW (ref 30.0–100.0)

## 2020-02-27 LAB — TSH: TSH: 1.03 u[IU]/mL (ref 0.450–4.500)

## 2020-02-27 MED ORDER — VITAMIN D (ERGOCALCIFEROL) 1.25 MG (50000 UNIT) PO CAPS
50000.0000 [IU] | ORAL_CAPSULE | ORAL | 0 refills | Status: DC
Start: 2020-02-27 — End: 2022-07-20

## 2020-02-27 NOTE — Telephone Encounter (Signed)
Leda Min, RN  02/27/2020 9:03 AM EST Back to Top    Left message to call Noreene Larsson, RN at [facility] [phone].

## 2020-02-27 NOTE — Telephone Encounter (Signed)
-----   Message from Patton Salles, MD sent at 02/27/2020  9:02 AM EST ----- Please contact patient with results.   Her blood pregnancy test is negative.   Her blood counts, chemistries, and thyroid are normal.   Her vit D is quite low.  I do recommend prescription vitamin D 50,000 IU weekly for 3 months.  Please schedule a lab recheck in 3 months.

## 2020-02-27 NOTE — Telephone Encounter (Signed)
Spoke with patient, advised per Dr. Edward Jolly.  Rx for Vit D to verified pharmacy.  Patient declines to schedule Lab appt at this time, patient will be traveling to [place] in January and is unsure of her return date. She will return call to schedule.  Patient verbalizes understanding and is agreeable.   Encounter closed.

## 2020-04-02 ENCOUNTER — Telehealth: Payer: Self-pay

## 2020-04-02 NOTE — Telephone Encounter (Signed)
Left message to call triage at [facility] [phone].  ° °

## 2020-04-02 NOTE — Telephone Encounter (Signed)
Patient is calling in regards to wanting to know if Dr. Edward Jolly can order tests for her today to complete for a surgery she is having out of the country.

## 2020-04-05 NOTE — Telephone Encounter (Signed)
Spoke with pt. Pt states called Friday to see about labs before surgery in [place]. Pt states already in [place] today. Pt thankful for call back.  Routing to Dr Edward Jolly for udpate and review Encounter closed

## 2020-06-28 ENCOUNTER — Ambulatory Visit: Payer: BC Managed Care – PPO | Admitting: Podiatry

## 2020-08-12 ENCOUNTER — Other Ambulatory Visit: Payer: Self-pay | Admitting: Obstetrics and Gynecology

## 2020-08-12 ENCOUNTER — Telehealth: Payer: Self-pay | Admitting: Obstetrics and Gynecology

## 2020-08-12 DIAGNOSIS — R7989 Other specified abnormal findings of blood chemistry: Secondary | ICD-10-CM

## 2020-08-12 NOTE — Telephone Encounter (Signed)
Please contact patient to schedule a lab visit to recheck her vitamin D which was low at 15.3 on 02/26/20.  Please let her know that we moved to our new office.

## 2020-08-26 NOTE — Telephone Encounter (Signed)
Called patient and left message to call and schedule lab appointment. Left detailed message with new office phone number and stated we have moved to 3rd floor, [address].

## 2020-09-01 DIAGNOSIS — Z20822 Contact with and (suspected) exposure to covid-19: Secondary | ICD-10-CM | POA: Diagnosis not present

## 2020-10-12 DIAGNOSIS — U071 COVID-19: Secondary | ICD-10-CM

## 2020-10-12 DIAGNOSIS — Z20822 Contact with and (suspected) exposure to covid-19: Secondary | ICD-10-CM | POA: Diagnosis not present

## 2020-10-12 HISTORY — DX: COVID-19: U07.1

## 2020-10-31 ENCOUNTER — Encounter: Payer: Self-pay | Admitting: Obstetrics and Gynecology

## 2020-11-24 IMAGING — DX DG CHEST 2V
2 series · 2 of 2 positions shown · non-contrast
Comparison: None.

CLINICAL DATA: Stabbing intermittent sternal pain, worse with
inspiration.

EXAM:
CHEST - 2 VIEW

[chest pa]
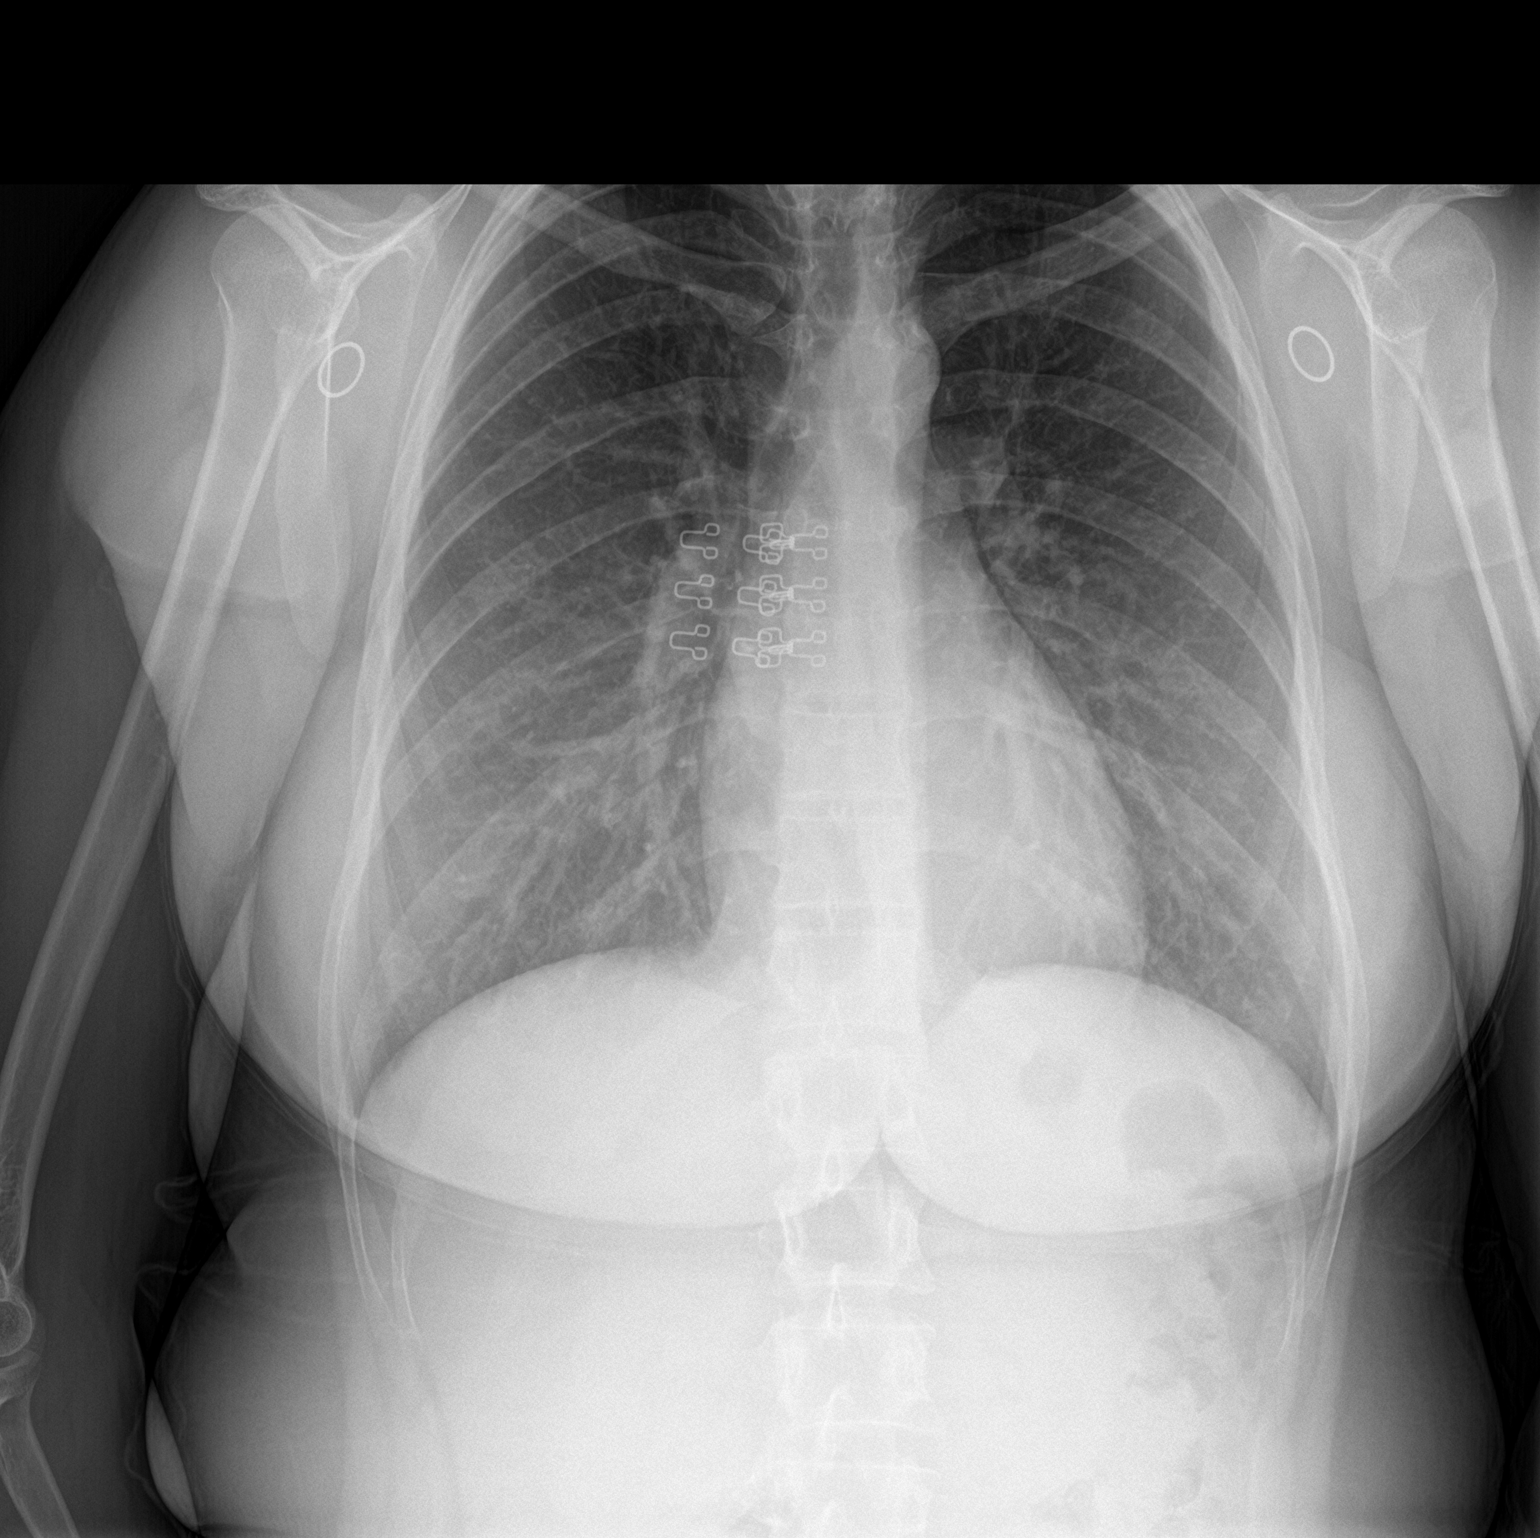

[chest lat]
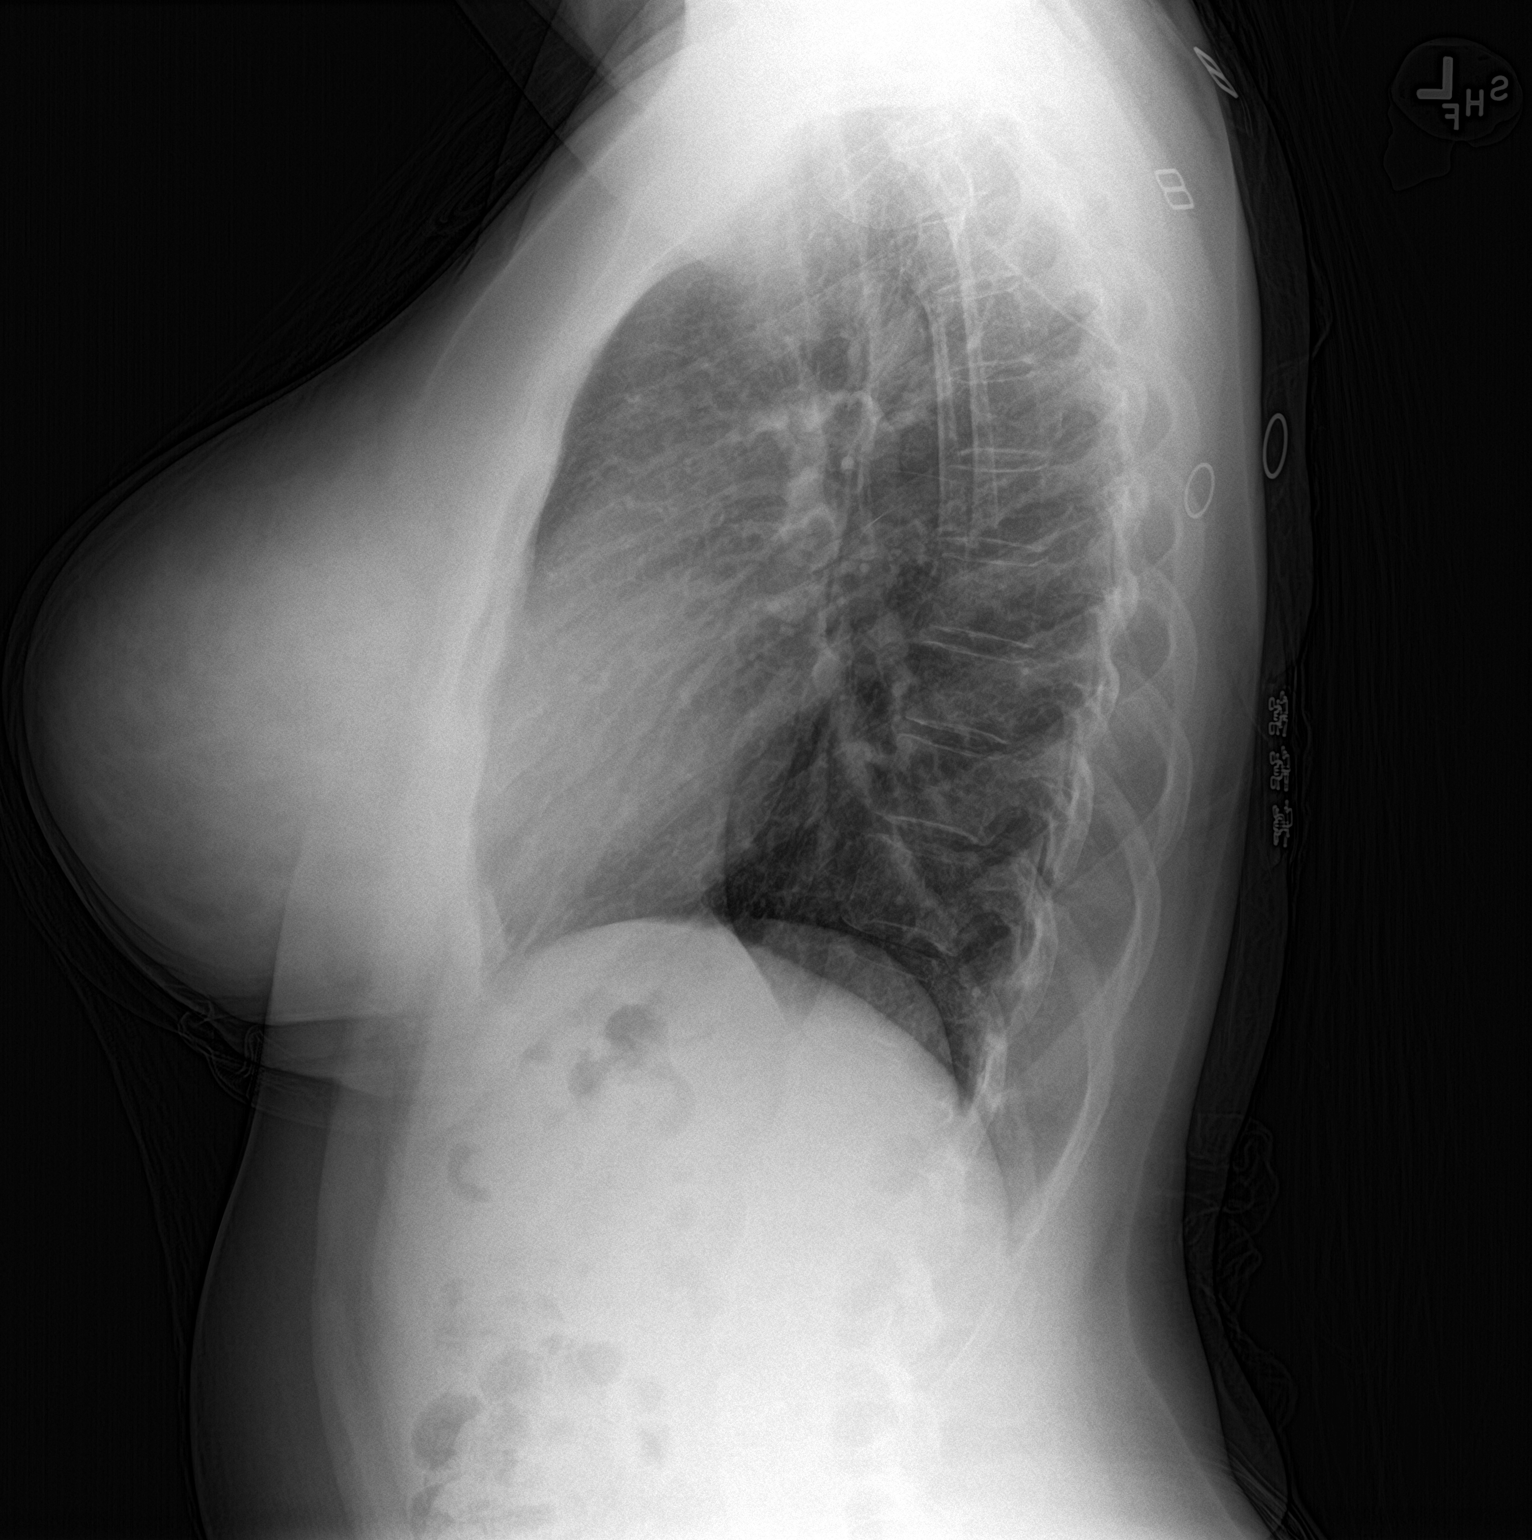

[2 of 2 positions shown; findings below may reference images not displayed]

FINDINGS: Trachea is midline. Heart size normal. Lungs may be minimally
hyperinflated but are otherwise clear. No pleural fluid.
IMPRESSION: Minimal hyperinflation.  No acute findings.

## 2021-02-02 ENCOUNTER — Telehealth: Payer: Self-pay | Admitting: *Deleted

## 2021-02-02 NOTE — Telephone Encounter (Signed)
Patient called and left message in triage voicemail requesting to labs. I left message for patient to call.

## 2021-02-02 NOTE — Telephone Encounter (Addendum)
Pt called wanting to make an office visit to speak with Dr. Edward Jolly about preventive care. [facility] appt pool aware to call and make a annual visit for this pt.

## 2021-02-02 NOTE — Telephone Encounter (Signed)
I can see her for an annual exam on 02/15/21 at 4:00 pm, arrive at 3:45 pm.

## 2021-02-02 NOTE — Telephone Encounter (Signed)
Patient requesting Complete blood work before she goes to [place] in 3 weeks.Patient will be having surgery in [place]: Breast reduction, Also to treat Diastasis Recti. I explained to patient she is over due for annual exam. Per [facility] appointment pool we have no openings the remainder of the year. I will route to Dr. Edward Jolly for recommendations.

## 2021-02-03 NOTE — Telephone Encounter (Signed)
Pt declined visit. She states she will go to Primary care office. Encounter closed.

## 2021-02-15 HISTORY — PX: BREAST REDUCTION SURGERY: SHX8

## 2021-04-28 DIAGNOSIS — R200 Anesthesia of skin: Secondary | ICD-10-CM | POA: Diagnosis not present

## 2021-04-28 DIAGNOSIS — G4489 Other headache syndrome: Secondary | ICD-10-CM | POA: Diagnosis not present

## 2021-04-28 DIAGNOSIS — R519 Headache, unspecified: Secondary | ICD-10-CM | POA: Diagnosis not present

## 2021-04-28 DIAGNOSIS — R0689 Other abnormalities of breathing: Secondary | ICD-10-CM | POA: Diagnosis not present

## 2021-04-28 DIAGNOSIS — R42 Dizziness and giddiness: Secondary | ICD-10-CM | POA: Diagnosis not present

## 2021-04-28 DIAGNOSIS — R450 Nervousness: Secondary | ICD-10-CM | POA: Diagnosis not present

## 2021-04-28 DIAGNOSIS — R0789 Other chest pain: Secondary | ICD-10-CM | POA: Diagnosis not present

## 2021-04-28 DIAGNOSIS — R079 Chest pain, unspecified: Secondary | ICD-10-CM | POA: Diagnosis not present

## 2021-04-28 DIAGNOSIS — Z20822 Contact with and (suspected) exposure to covid-19: Secondary | ICD-10-CM | POA: Diagnosis not present

## 2021-04-28 DIAGNOSIS — H9202 Otalgia, left ear: Secondary | ICD-10-CM | POA: Diagnosis not present

## 2022-07-20 ENCOUNTER — Other Ambulatory Visit (HOSPITAL_COMMUNITY)
Admission: RE | Admit: 2022-07-20 | Discharge: 2022-07-20 | Disposition: A | Discharge status: Home / Self Care | Payer: BC Managed Care – PPO | Source: Ambulatory Visit | Attending: Obstetrics and Gynecology | Admitting: Obstetrics and Gynecology

## 2022-07-20 ENCOUNTER — Ambulatory Visit: Payer: BC Managed Care – PPO | Admitting: Obstetrics and Gynecology

## 2022-07-20 ENCOUNTER — Encounter: Payer: Self-pay | Admitting: Obstetrics and Gynecology

## 2022-07-20 VITALS — BP 124/82 | HR 81 | Ht 66.0 in | Wt 151.0 lb

## 2022-07-20 DIAGNOSIS — R300 Dysuria: Secondary | ICD-10-CM

## 2022-07-20 DIAGNOSIS — Z114 Encounter for screening for human immunodeficiency virus [HIV]: Secondary | ICD-10-CM

## 2022-07-20 DIAGNOSIS — Z113 Encounter for screening for infections with a predominantly sexual mode of transmission: Secondary | ICD-10-CM | POA: Diagnosis not present

## 2022-07-20 DIAGNOSIS — R350 Frequency of micturition: Secondary | ICD-10-CM | POA: Diagnosis not present

## 2022-07-20 DIAGNOSIS — N898 Other specified noninflammatory disorders of vagina: Secondary | ICD-10-CM

## 2022-07-20 DIAGNOSIS — R102 Pelvic and perineal pain: Secondary | ICD-10-CM

## 2022-07-20 DIAGNOSIS — K5900 Constipation, unspecified: Secondary | ICD-10-CM | POA: Diagnosis not present

## 2022-07-20 DIAGNOSIS — Z1159 Encounter for screening for other viral diseases: Secondary | ICD-10-CM

## 2022-07-20 DIAGNOSIS — N921 Excessive and frequent menstruation with irregular cycle: Secondary | ICD-10-CM | POA: Diagnosis not present

## 2022-07-20 NOTE — Progress Notes (Signed)
GYNECOLOGY  VISIT   HPI: 32 y.o.   Married  [place]  female   [phone] with Patient's last menstrual period was 06/27/2022.   here for   painful cramps/irregular cycles. Pt noticed warts showed up a few months ago moving closer to the vagina and anus region. When pt went to doctor for cramps, they informed her of uterine prolapse and she reports she struggles to urinate and pain anytime she has the cramping. It comes and goes regularly.  Did breast reduction, implants and abdominoplasty 1.5 years ago.  For 4 -5 months, she is noticing pain and lumps of the left lateral breast.  No trauma.   Stopped birth control pills.  Declines pregnancy.  New partner.  Using condoms.   The last 6 months, periods can spread out to very 6 weeks and last 10 - 12 days with light flow toward the end.  Having cramping. PMS symptoms of headaches, breast tenderness, and irritability.   Having some colic symptoms when she is not on her menstruation.  Comes and goes.  She thinks it is bowel in nature.  Bowel movements do not seem to consistently help the pain.  Uses natural products to help constipation.  Tried Miralax and had increased colicky pain.   Noticing skin changes, potential wart, on her right thigh and near her anus.   When she voids, she notices vaginal discharge which is clear and white.   GYNECOLOGIC HISTORY: Patient's last menstrual period was 06/27/2022. Contraception: n/a Menopausal hormone therapy:  n/a Last mammogram:  n/a Last pap smear:   2019, neg per pt        OB History     Gravida  1   Para  1   Term  1   Preterm      AB      Living  1      SAB      IAB      Ectopic      Multiple      Live Births                 There are no problems to display for this patient.   Past Medical History:  Diagnosis Date   Anxiety    COVID 10/12/2020   Dysmenorrhea    Low vitamin D level    Migraines    with aura    Past Surgical History:  Procedure  Laterality Date   ABDOMINOPLASTY     BREAST REDUCTION SURGERY Bilateral 02/2021   CESAREAN SECTION  2014   in [place]    No current outpatient medications on file.   No current facility-administered medications for this visit.     ALLERGIES: Patient has no known allergies.  Family History  Problem Relation Age of Onset   Hypertension Mother    Heart disease Mother     Social History   Socioeconomic History   Marital status: Married    Spouse name: Not on file   Number of children: Not on file   Years of education: Not on file   Highest education level: Not on file  Occupational History   Not on file  Tobacco Use   Smoking status: Never   Smokeless tobacco: Never  Vaping Use   Vaping Use: Never used  Substance and Sexual Activity   Alcohol use: Yes    Alcohol/week: 0.0 - 1.0 standard drinks of alcohol    Comment: occ   Drug use: Never   Sexual activity:  Yes    Partners: Male    Birth control/protection: None  Other Topics Concern   Not on file  Social History Narrative   Not on file   Social Determinants of Health   Financial Resource Strain: Not on file  Food Insecurity: Not on file  Transportation Needs: Not on file  Physical Activity: Not on file  Stress: Not on file  Social Connections: Not on file  Intimate Partner Violence: Not on file    Review of Systems  Genitourinary:  Positive for difficulty urinating, dysuria, flank pain and pelvic pain.    PHYSICAL EXAMINATION:    BP 124/82 (BP Location: Right Arm, Patient Position: Sitting, Cuff Size: Normal)   Pulse 81   Ht 5\' 6"  (1.676 m)   Wt 151 lb (68.5 kg)   LMP 06/27/2022   SpO2 98%   BMI 24.37 kg/m     General appearance: alert, cooperative and appears stated age Head: Normocephalic, without obvious abnormality, atraumatic Neck: no adenopathy, supple, symmetrical, trachea midline and thyroid normal to inspection and palpation Lungs: clear to auscultation bilaterally Breasts: right -  normal appearance, implant present, no masses or tenderness, No nipple retraction or dimpling, No nipple discharge or bleeding, No axillary or supraclavicular adenopathy Left - normal appearance, implant present, lumpiness along left lateral breast, no tenderness, No nipple retraction or dimpling, No nipple discharge or bleeding, No axillary or supraclavicular adenopathy Heart: regular rate and rhythm Abdomen: soft, non-tender, no masses,  no organomegaly Extremities: extremities normal, atraumatic, no cyanosis or edema Skin: Skin color, texture, turgor normal. No rashes or lesions Lymph nodes: Cervical, supraclavicular, and axillary nodes normal. No abnormal inguinal nodes palpated Neurologic: Grossly normal  Pelvic: External genitalia:  small raised skin lesions - warts versus nevi.               Urethra:  normal appearing urethra with no masses, tenderness or lesions              Bartholins and Skenes: normal                 Vagina: normal appearing vagina with normal color and discharge, no lesions              Cervix: no lesions                Bimanual Exam:  Uterus:  normal size, contour, position, consistency, mobility, non-tender              Adnexa: no mass, fullness, tenderness              Rectal exam: yes.  Confirms.              Anus:  normal sphincter tone, small raised skin lesion.  Chaperone was present for exam:  Warren Lacy, CMA  ASSESSMENT  Pelvic pain and dysmenorrhea.  Prolonged menses.  Migraine with aura. Dysuria.  Urinary frequency. Vaginal discharge.  STD screening.  Left breast mass.  Status post bilateral implants.  Skin tags versus genital warts.  Constipation.   PLAN Urinalysis and reflex culture.  STD screening.  Vaginitis testing.  [facility]  Check TSH. Referral to GI.  Return for annual exam and pap.  May need tx of condyloma/colposcopy.   35 min  total time was spent for this patient  encounter, including preparation, face-to-face counseling with the patient, coordination of care, and documentation of the encounter.

## 2022-07-21 ENCOUNTER — Encounter: Payer: Self-pay | Admitting: Gastroenterology

## 2022-07-21 ENCOUNTER — Telehealth: Payer: Self-pay | Admitting: Gastroenterology

## 2022-07-21 LAB — CERVICOVAGINAL ANCILLARY ONLY
Bacterial Vaginitis (gardnerella): NEGATIVE
Candida Glabrata: NEGATIVE
Candida Vaginitis: POSITIVE — AB
Chlamydia: NEGATIVE
Comment: NEGATIVE
Comment: NEGATIVE
Comment: NEGATIVE
Comment: NEGATIVE
Comment: NEGATIVE
Comment: NORMAL
Neisseria Gonorrhea: NEGATIVE
Trichomonas: NEGATIVE

## 2022-07-21 LAB — RPR: RPR Ser Ql: NONREACTIVE

## 2022-07-21 LAB — HIV ANTIBODY (ROUTINE TESTING W REFLEX): HIV 1&2 Ab, 4th Generation: NONREACTIVE

## 2022-07-21 LAB — TSH: TSH: 1.23 mIU/L

## 2022-07-21 LAB — HEPATITIS C ANTIBODY: Hepatitis C Ab: NONREACTIVE

## 2022-07-21 NOTE — Telephone Encounter (Signed)
error 

## 2022-07-22 LAB — URINALYSIS, COMPLETE W/RFL CULTURE
Bilirubin Urine: NEGATIVE
Casts: NONE SEEN /LPF
Crystals: NONE SEEN /HPF
Glucose, UA: NEGATIVE
Hyaline Cast: NONE SEEN /LPF
Ketones, ur: NEGATIVE
Leukocyte Esterase: NEGATIVE
Nitrites, Initial: NEGATIVE
Protein, ur: NEGATIVE
Specific Gravity, Urine: 1.02 (ref 1.001–1.035)
WBC, UA: NONE SEEN /HPF (ref 0–5)
Yeast: NONE SEEN /HPF
pH: 6 (ref 5.0–8.0)

## 2022-07-22 LAB — URINE CULTURE
MICRO NUMBER:: 14782701
Result:: NO GROWTH
SPECIMEN QUALITY:: ADEQUATE

## 2022-07-22 LAB — CULTURE INDICATED

## 2022-07-23 ENCOUNTER — Telehealth: Payer: Self-pay | Admitting: Obstetrics and Gynecology

## 2022-07-23 DIAGNOSIS — N6022 Fibroadenosis of left breast: Secondary | ICD-10-CM

## 2022-07-23 NOTE — Telephone Encounter (Signed)
Please schedule a bilateral diagnostic mammogram and left breast ultrasound at [facility]. My patient has a bilateral implants and lumpiness along her left lateral breast.

## 2022-07-24 NOTE — Telephone Encounter (Signed)
Left message for patient to call me and let me know if she has ever had any breast imaging before and if so where.

## 2022-07-25 NOTE — Telephone Encounter (Signed)
Amb referral orders placed for [facility].

## 2022-07-26 NOTE — Telephone Encounter (Signed)
Spoke with patient. She said she did have breast imaging performed a year and a half ago in [place] when she had her breast implant surgery.  She thinks she may have films in her medical records at home.  She also thinks she had breast imaging 4 years ago in Dr. Rica Records office.  I am checking on that to see if in office imaging was performed and if so where those films are.

## 2022-07-26 NOTE — Telephone Encounter (Signed)
I spoke with Brandy Turner who worked with Dr. Edward Jolly in her office 4 years ago and she said they did not perform breast imaging in their office.  I informed patient.  She cannot recall anywhere else it was performed so I am unable to obtain images for this.  She will check in her med records to see if she has films from [place] and she will let us know.

## 2022-07-28 NOTE — Telephone Encounter (Signed)
Pt LVM in triage line returning call to [facility]. I returned call to pt and inquired if she had acquired any reports/images from previous imaging done in [place]. She reported, yes. Had done in 02/2021.  Orders have already been placed in EMR. # to [facility] given to pt and advised her to call and schedule at her earliest convenience and she will be given their first available appt. She voiced understanding.

## 2022-07-31 NOTE — Telephone Encounter (Signed)
Thank you for the update!

## 2022-07-31 NOTE — Telephone Encounter (Signed)
Patient scheduled 08/10/22 at 1:30pm.

## 2022-08-10 ENCOUNTER — Ambulatory Visit
Admission: RE | Admit: 2022-08-10 | Discharge: 2022-08-10 | Disposition: A | Discharge status: Home / Self Care | Payer: BC Managed Care – PPO | Source: Ambulatory Visit | Attending: Obstetrics and Gynecology | Admitting: Obstetrics and Gynecology

## 2022-08-10 DIAGNOSIS — N644 Mastodynia: Secondary | ICD-10-CM | POA: Diagnosis not present

## 2022-08-10 DIAGNOSIS — N6022 Fibroadenosis of left breast: Secondary | ICD-10-CM

## 2022-08-23 NOTE — Progress Notes (Signed)
32 y.o. [id number] Married [place] female here for annual exam.    Seen 07/20/22 and noticed some warts near her vagina and anus.  New partner who also has genital warts.   Neg STD testing on 07/20/22.   Used Monistat for yeast infection following testing on 07/20/22.   Declines contraception.  Off birth control for one year.  She has sensitivity to condoms.  No allergy to Latex use.   PCP:   none  Patient's last menstrual period was 08/03/2022.     Period lasted 2 weeks.  Usually lasts 9 days.        Sexually active: Yes.    The current method of family planning is none.    Exercising: No.   exercise Smoker:  no  Health Maintenance: Pap:  2022 in [place]-neg, 1/19 neg per patient History of abnormal Pap:  no MMG:  08-10-22 bilateral & left breast u/s category c density birads 2:neg Colonoscopy:  n/a BMD:   n/a  Result  n/a TDaP:  02/2017 Gardasil:   no HIV:neg 2024 Hep C: neg 2024 Screening Labs:      reports that she has never smoked. She has never used smokeless tobacco. She reports that she does not currently use alcohol. She reports that she does not use drugs.  Past Medical History:  Diagnosis Date   Anxiety    COVID 10/12/2020   Dysmenorrhea    Low vitamin D level    Migraines    with aura    Past Surgical History:  Procedure Laterality Date   ABDOMINOPLASTY     AUGMENTATION MAMMAPLASTY     BREAST REDUCTION SURGERY Bilateral 02/2021   CESAREAN SECTION  2014   in [place]   tummy tuck      No current outpatient medications on file.   No current facility-administered medications for this visit.    Family History  Problem Relation Age of Onset   Hypertension Mother    Heart disease Mother     Review of Systems  Constitutional: Negative.   HENT: Negative.    Eyes: Negative.   Respiratory: Negative.    Cardiovascular: Negative.   Gastrointestinal: Negative.   Endocrine: Negative.   Genitourinary: Negative.   Musculoskeletal: Negative.   Skin:  Negative.   Allergic/Immunologic: Negative.   Neurological: Negative.   Hematological: Negative.   Psychiatric/Behavioral: Negative.      Exam:   BP 106/70   Pulse 80   Ht 5' 4.25" (1.632 m)   Wt 150 lb (68 kg)   LMP 08/03/2022   SpO2 97%   BMI 25.55 kg/m     General appearance: alert, cooperative and appears stated age Head: normocephalic, without obvious abnormality, atraumatic Neck: no adenopathy, supple, symmetrical, trachea midline and thyroid normal to inspection and palpation Lungs: clear to auscultation bilaterally Breasts: bilateral augmentation, no masses or tenderness, No nipple retraction or dimpling, No nipple discharge or bleeding, No axillary adenopathy Heart: regular rate and rhythm Abdomen: soft, non-tender; no masses, no organomegaly Extremities: extremities normal, atraumatic, no cyanosis or edema Skin: skin color, texture, turgor normal. No rashes or lesions Lymph nodes: cervical, supraclavicular, and axillary nodes normal. Neurologic: grossly normal  Pelvic: External genitalia:  no lesions.  No condyloma noted.              No abnormal inguinal nodes palpated.              Urethra:  normal appearing urethra with no masses, tenderness or lesions  Bartholins and Skenes: normal                 Vagina: normal appearing vagina with normal color and discharge, no lesions              Cervix: no lesions              Pap taken: yes Bimanual Exam:  Uterus:  normal size, contour, position, consistency, mobility, non-tender              Adnexa: no mass, fullness, tenderness           Chaperone was present for exam:  Senaida Ores, CMA  Assessment:   Well woman visit with gynecologic exam. Hx breast augmentation.  Irregular menses.  Cervical cancer screening.   Plan: Mammogram screening discussed. Self breast awareness reviewed. Pap and HR HPV collected.  Guidelines for Calcium, Vitamin D, regular exercise program including cardiovascular and weight  bearing exercise. Check hCG and cholesterol. Start Gardasil vaccine series.   Follow up annually and prn.   After visit summary provided.

## 2022-08-25 ENCOUNTER — Other Ambulatory Visit (HOSPITAL_COMMUNITY)
Admission: RE | Admit: 2022-08-25 | Discharge: 2022-08-25 | Disposition: A | Discharge status: Home / Self Care | Payer: BC Managed Care – PPO | Source: Ambulatory Visit | Attending: Obstetrics and Gynecology | Admitting: Obstetrics and Gynecology

## 2022-08-25 ENCOUNTER — Encounter: Payer: Self-pay | Admitting: Obstetrics and Gynecology

## 2022-08-25 ENCOUNTER — Ambulatory Visit (INDEPENDENT_AMBULATORY_CARE_PROVIDER_SITE_OTHER): Payer: BC Managed Care – PPO | Admitting: Obstetrics and Gynecology

## 2022-08-25 VITALS — BP 106/70 | HR 80 | Ht 64.25 in | Wt 150.0 lb

## 2022-08-25 DIAGNOSIS — Z23 Encounter for immunization: Secondary | ICD-10-CM

## 2022-08-25 DIAGNOSIS — Z124 Encounter for screening for malignant neoplasm of cervix: Secondary | ICD-10-CM | POA: Insufficient documentation

## 2022-08-25 DIAGNOSIS — Z0000 Encounter for general adult medical examination without abnormal findings: Secondary | ICD-10-CM | POA: Diagnosis not present

## 2022-08-25 DIAGNOSIS — Z01419 Encounter for gynecological examination (general) (routine) without abnormal findings: Secondary | ICD-10-CM | POA: Diagnosis not present

## 2022-08-25 DIAGNOSIS — N926 Irregular menstruation, unspecified: Secondary | ICD-10-CM

## 2022-08-25 DIAGNOSIS — E559 Vitamin D deficiency, unspecified: Secondary | ICD-10-CM | POA: Diagnosis not present

## 2022-08-25 DIAGNOSIS — E785 Hyperlipidemia, unspecified: Secondary | ICD-10-CM | POA: Diagnosis not present

## 2022-08-25 NOTE — Patient Instructions (Signed)

## 2022-08-26 LAB — LIPID PANEL
Cholesterol: 241 mg/dL — ABNORMAL HIGH (ref <200)
HDL: 85 mg/dL (ref >=50)
LDL Cholesterol (Calc): 143 mg/dL (calc) — ABNORMAL HIGH
Non-HDL Cholesterol (Calc): 156 mg/dL (calc) — ABNORMAL HIGH (ref <130)
Total CHOL/HDL Ratio: 2.8 (calc) (ref <5.0)
Triglycerides: 44 mg/dL (ref <150)

## 2022-08-26 LAB — HCG, QUANTITATIVE, PREGNANCY: HCG, Total, QN: <5 m[IU]/mL

## 2022-08-29 LAB — CYTOLOGY - PAP
Comment: NEGATIVE
Diagnosis: NEGATIVE
High risk HPV: NEGATIVE

## 2022-11-06 ENCOUNTER — Ambulatory Visit: Payer: BC Managed Care – PPO | Admitting: Gastroenterology
